# Patient Record
Sex: Male | Born: 1935 | Race: White | Hispanic: No | Marital: Married
Health system: Southern US, Community
[De-identification: ages and names within clinical notes are randomized; demographics above are authoritative.]

---

## 1998-03-21 ENCOUNTER — Encounter: Admission: RE | Admit: 1998-03-21 | Discharge: 1998-06-19 | Payer: Self-pay | Admitting: Radiation Oncology

## 1998-06-20 ENCOUNTER — Inpatient Hospital Stay (HOSPITAL_COMMUNITY): Admission: RE | Admit: 1998-06-20 | Discharge: 1998-06-21 | Payer: Self-pay | Admitting: Urology

## 1998-07-21 ENCOUNTER — Encounter: Admission: RE | Admit: 1998-07-21 | Discharge: 1998-10-19 | Payer: Self-pay | Admitting: Radiation Oncology

## 1999-01-14 ENCOUNTER — Ambulatory Visit (HOSPITAL_COMMUNITY): Admission: RE | Admit: 1999-01-14 | Discharge: 1999-01-14 | Payer: Self-pay

## 1999-12-23 ENCOUNTER — Encounter (INDEPENDENT_AMBULATORY_CARE_PROVIDER_SITE_OTHER): Payer: Self-pay | Admitting: Specialist

## 1999-12-23 ENCOUNTER — Ambulatory Visit (HOSPITAL_BASED_OUTPATIENT_CLINIC_OR_DEPARTMENT_OTHER): Admission: RE | Admit: 1999-12-23 | Discharge: 1999-12-23 | Payer: Self-pay | Admitting: Orthopedic Surgery

## 2000-05-11 ENCOUNTER — Other Ambulatory Visit: Admission: RE | Admit: 2000-05-11 | Discharge: 2000-05-11 | Payer: Self-pay

## 2001-02-17 ENCOUNTER — Encounter (INDEPENDENT_AMBULATORY_CARE_PROVIDER_SITE_OTHER): Payer: Self-pay | Admitting: Specialist

## 2001-02-17 ENCOUNTER — Ambulatory Visit (HOSPITAL_COMMUNITY): Admission: RE | Admit: 2001-02-17 | Discharge: 2001-02-17 | Payer: Self-pay | Admitting: Gastroenterology

## 2001-09-25 ENCOUNTER — Ambulatory Visit (HOSPITAL_BASED_OUTPATIENT_CLINIC_OR_DEPARTMENT_OTHER): Admission: RE | Admit: 2001-09-25 | Discharge: 2001-09-25 | Payer: Self-pay | Admitting: Internal Medicine

## 2001-11-10 ENCOUNTER — Ambulatory Visit (HOSPITAL_BASED_OUTPATIENT_CLINIC_OR_DEPARTMENT_OTHER): Admission: RE | Admit: 2001-11-10 | Discharge: 2001-11-10 | Payer: Self-pay | Admitting: Internal Medicine

## 2002-10-25 ENCOUNTER — Ambulatory Visit (HOSPITAL_COMMUNITY): Admission: RE | Admit: 2002-10-25 | Discharge: 2002-10-25 | Payer: Self-pay | Admitting: Gastroenterology

## 2003-03-19 ENCOUNTER — Ambulatory Visit (HOSPITAL_COMMUNITY): Admission: RE | Admit: 2003-03-19 | Discharge: 2003-03-19 | Payer: Self-pay | Admitting: Gastroenterology

## 2003-03-19 ENCOUNTER — Encounter: Payer: Self-pay | Admitting: Gastroenterology

## 2003-04-05 ENCOUNTER — Ambulatory Visit (HOSPITAL_BASED_OUTPATIENT_CLINIC_OR_DEPARTMENT_OTHER): Admission: RE | Admit: 2003-04-05 | Discharge: 2003-04-05 | Payer: Self-pay | Admitting: Otolaryngology

## 2003-04-05 ENCOUNTER — Encounter (INDEPENDENT_AMBULATORY_CARE_PROVIDER_SITE_OTHER): Payer: Self-pay | Admitting: Specialist

## 2003-10-25 ENCOUNTER — Ambulatory Visit (HOSPITAL_COMMUNITY): Admission: RE | Admit: 2003-10-25 | Discharge: 2003-10-25 | Payer: Self-pay | Admitting: Oncology

## 2003-11-21 ENCOUNTER — Ambulatory Visit (HOSPITAL_COMMUNITY): Admission: RE | Admit: 2003-11-21 | Discharge: 2003-11-21 | Payer: Self-pay | Admitting: General Surgery

## 2003-12-06 ENCOUNTER — Encounter (INDEPENDENT_AMBULATORY_CARE_PROVIDER_SITE_OTHER): Payer: Self-pay | Admitting: Specialist

## 2003-12-06 ENCOUNTER — Ambulatory Visit (HOSPITAL_COMMUNITY): Admission: RE | Admit: 2003-12-06 | Discharge: 2003-12-07 | Payer: Self-pay | Admitting: General Surgery

## 2004-06-29 ENCOUNTER — Encounter: Admission: RE | Admit: 2004-06-29 | Discharge: 2004-06-29 | Payer: Self-pay | Admitting: Gastroenterology

## 2004-07-07 ENCOUNTER — Encounter: Admission: RE | Admit: 2004-07-07 | Discharge: 2004-07-07 | Payer: Self-pay | Admitting: General Surgery

## 2004-07-07 ENCOUNTER — Ambulatory Visit (HOSPITAL_COMMUNITY): Admission: RE | Admit: 2004-07-07 | Discharge: 2004-07-07 | Payer: Self-pay | Admitting: Oncology

## 2004-07-08 ENCOUNTER — Ambulatory Visit (HOSPITAL_BASED_OUTPATIENT_CLINIC_OR_DEPARTMENT_OTHER): Admission: RE | Admit: 2004-07-08 | Discharge: 2004-07-08 | Payer: Self-pay | Admitting: General Surgery

## 2004-07-08 ENCOUNTER — Ambulatory Visit (HOSPITAL_COMMUNITY): Admission: RE | Admit: 2004-07-08 | Discharge: 2004-07-08 | Payer: Self-pay | Admitting: General Surgery

## 2004-07-08 ENCOUNTER — Encounter (INDEPENDENT_AMBULATORY_CARE_PROVIDER_SITE_OTHER): Payer: Self-pay | Admitting: Specialist

## 2004-07-08 ENCOUNTER — Encounter (INDEPENDENT_AMBULATORY_CARE_PROVIDER_SITE_OTHER): Payer: Self-pay | Admitting: General Surgery

## 2004-07-21 ENCOUNTER — Ambulatory Visit (HOSPITAL_COMMUNITY): Admission: RE | Admit: 2004-07-21 | Discharge: 2004-07-21 | Payer: Self-pay | Admitting: Cardiovascular Disease

## 2004-08-04 ENCOUNTER — Inpatient Hospital Stay (HOSPITAL_COMMUNITY): Admission: EM | Admit: 2004-08-04 | Discharge: 2004-08-07 | Payer: Self-pay | Admitting: Emergency Medicine

## 2004-09-16 ENCOUNTER — Ambulatory Visit (HOSPITAL_COMMUNITY): Admission: RE | Admit: 2004-09-16 | Discharge: 2004-09-16 | Payer: Self-pay | Admitting: Oncology

## 2004-10-20 ENCOUNTER — Ambulatory Visit: Payer: Self-pay | Admitting: Oncology

## 2005-01-04 ENCOUNTER — Ambulatory Visit: Payer: Self-pay | Admitting: Oncology

## 2005-01-07 ENCOUNTER — Ambulatory Visit (HOSPITAL_COMMUNITY): Admission: RE | Admit: 2005-01-07 | Discharge: 2005-01-07 | Payer: Self-pay | Admitting: Oncology

## 2005-02-18 ENCOUNTER — Ambulatory Visit: Payer: Self-pay

## 2005-02-23 ENCOUNTER — Ambulatory Visit: Payer: Self-pay | Admitting: Cardiology

## 2005-02-25 ENCOUNTER — Ambulatory Visit: Payer: Self-pay | Admitting: Cardiology

## 2005-04-30 ENCOUNTER — Ambulatory Visit: Payer: Self-pay | Admitting: Oncology

## 2005-05-03 ENCOUNTER — Ambulatory Visit: Payer: Self-pay | Admitting: Cardiology

## 2005-05-03 ENCOUNTER — Ambulatory Visit (HOSPITAL_COMMUNITY): Admission: RE | Admit: 2005-05-03 | Discharge: 2005-05-03 | Payer: Self-pay | Admitting: Oncology

## 2005-06-08 ENCOUNTER — Encounter: Admission: RE | Admit: 2005-06-08 | Discharge: 2005-06-08 | Payer: Self-pay | Admitting: General Surgery

## 2005-06-10 ENCOUNTER — Ambulatory Visit (HOSPITAL_BASED_OUTPATIENT_CLINIC_OR_DEPARTMENT_OTHER): Admission: RE | Admit: 2005-06-10 | Discharge: 2005-06-10 | Payer: Self-pay | Admitting: General Surgery

## 2005-06-10 ENCOUNTER — Ambulatory Visit (HOSPITAL_COMMUNITY): Admission: RE | Admit: 2005-06-10 | Discharge: 2005-06-10 | Payer: Self-pay | Admitting: General Surgery

## 2005-06-10 ENCOUNTER — Encounter (INDEPENDENT_AMBULATORY_CARE_PROVIDER_SITE_OTHER): Payer: Self-pay | Admitting: Specialist

## 2005-10-22 ENCOUNTER — Ambulatory Visit: Payer: Self-pay | Admitting: Oncology

## 2005-10-27 ENCOUNTER — Ambulatory Visit (HOSPITAL_COMMUNITY): Admission: RE | Admit: 2005-10-27 | Discharge: 2005-10-27 | Payer: Self-pay | Admitting: Oncology

## 2005-12-10 ENCOUNTER — Ambulatory Visit: Payer: Self-pay | Admitting: Oncology

## 2006-04-21 ENCOUNTER — Ambulatory Visit: Payer: Self-pay | Admitting: Oncology

## 2006-04-25 LAB — CBC WITH DIFFERENTIAL/PLATELET
BASO%: 1 % (ref 0.0–2.0)
Basophils Absolute: 0 10*3/uL (ref 0.0–0.1)
HCT: 46.6 % (ref 38.7–49.9)
HGB: 16.3 g/dL (ref 13.0–17.1)
LYMPH%: 20.8 % (ref 14.0–48.0)
MCHC: 35.1 g/dL (ref 32.0–35.9)
MONO#: 0.3 10*3/uL (ref 0.1–0.9)
NEUT%: 62.9 % (ref 40.0–75.0)
Platelets: 101 10*3/uL — ABNORMAL LOW (ref 145–400)
WBC: 3.5 10*3/uL — ABNORMAL LOW (ref 4.0–10.0)

## 2006-04-27 LAB — IMMUNOFIXATION ELECTROPHORESIS
IgA: 36 mg/dL — ABNORMAL LOW (ref 68–378)
IgG (Immunoglobin G), Serum: 226 mg/dL — ABNORMAL LOW (ref 694–1618)
IgM, Serum: 16 mg/dL — ABNORMAL LOW (ref 60–263)
Total Protein, Serum Electrophoresis: 5.9 g/dL — ABNORMAL LOW (ref 6.0–8.3)

## 2006-04-27 LAB — COMPREHENSIVE METABOLIC PANEL
BUN: 12 mg/dL (ref 6–23)
CO2: 29 mEq/L (ref 19–32)
Calcium: 9.2 mg/dL (ref 8.4–10.5)
Creatinine, Ser: 1 mg/dL (ref 0.4–1.5)
Glucose, Bld: 108 mg/dL — ABNORMAL HIGH (ref 70–99)
Total Bilirubin: 1.1 mg/dL (ref 0.3–1.2)

## 2006-04-27 LAB — LACTATE DEHYDROGENASE: LDH: 159 U/L (ref 94–250)

## 2006-06-06 ENCOUNTER — Ambulatory Visit: Payer: Self-pay | Admitting: Oncology

## 2006-07-21 ENCOUNTER — Ambulatory Visit: Payer: Self-pay | Admitting: Cardiology

## 2006-10-20 ENCOUNTER — Ambulatory Visit: Payer: Self-pay | Admitting: Oncology

## 2006-10-24 LAB — CBC WITH DIFFERENTIAL/PLATELET
Basophils Absolute: 0 10*3/uL (ref 0.0–0.1)
Eosinophils Absolute: 0.2 10*3/uL (ref 0.0–0.5)
HCT: 47.8 % (ref 38.7–49.9)
HGB: 16.8 g/dL (ref 13.0–17.1)
MONO#: 0.4 10*3/uL (ref 0.1–0.9)
NEUT%: 76.7 % — ABNORMAL HIGH (ref 40.0–75.0)
Platelets: 130 10*3/uL — ABNORMAL LOW (ref 145–400)
WBC: 6.2 10*3/uL (ref 4.0–10.0)
lymph#: 0.9 10*3/uL (ref 0.9–3.3)

## 2006-10-25 LAB — COMPREHENSIVE METABOLIC PANEL
AST: 14 U/L (ref 0–37)
Albumin: 4.6 g/dL (ref 3.5–5.2)
BUN: 13 mg/dL (ref 6–23)
CO2: 25 mEq/L (ref 19–32)
Calcium: 9 mg/dL (ref 8.4–10.5)
Chloride: 108 mEq/L (ref 96–112)
Glucose, Bld: 101 mg/dL — ABNORMAL HIGH (ref 70–99)
Potassium: 4.1 mEq/L (ref 3.5–5.3)

## 2006-10-25 LAB — LACTATE DEHYDROGENASE: LDH: 124 U/L (ref 94–250)

## 2006-12-02 ENCOUNTER — Ambulatory Visit: Payer: Self-pay | Admitting: Oncology

## 2007-01-19 ENCOUNTER — Ambulatory Visit: Payer: Self-pay | Admitting: Oncology

## 2007-01-23 LAB — CBC WITH DIFFERENTIAL/PLATELET
Basophils Absolute: 0.1 10*3/uL (ref 0.0–0.1)
EOS%: 4 % (ref 0.0–7.0)
Eosinophils Absolute: 0.2 10*3/uL (ref 0.0–0.5)
HGB: 18.1 g/dL — ABNORMAL HIGH (ref 13.0–17.1)
NEUT#: 2.4 10*3/uL (ref 1.5–6.5)
RDW: 14.6 % (ref 11.2–14.6)
lymph#: 1 10*3/uL (ref 0.9–3.3)

## 2007-01-23 LAB — COMPREHENSIVE METABOLIC PANEL
AST: 18 U/L (ref 0–37)
Albumin: 4.9 g/dL (ref 3.5–5.2)
BUN: 13 mg/dL (ref 6–23)
Calcium: 10 mg/dL (ref 8.4–10.5)
Chloride: 109 mEq/L (ref 96–112)
Potassium: 5.1 mEq/L (ref 3.5–5.3)
Sodium: 145 mEq/L (ref 135–145)
Total Protein: 6.5 g/dL (ref 6.0–8.3)

## 2007-01-23 LAB — IGG, IGA, IGM: IgM, Serum: 12 mg/dL — ABNORMAL LOW (ref 60–263)

## 2007-04-27 ENCOUNTER — Ambulatory Visit: Payer: Self-pay | Admitting: Oncology

## 2007-05-01 LAB — COMPREHENSIVE METABOLIC PANEL
Albumin: 4.4 g/dL (ref 3.5–5.2)
Alkaline Phosphatase: 72 U/L (ref 39–117)
BUN: 11 mg/dL (ref 6–23)
CO2: 29 mEq/L (ref 19–32)
Calcium: 9.6 mg/dL (ref 8.4–10.5)
Glucose, Bld: 147 mg/dL — ABNORMAL HIGH (ref 70–99)
Potassium: 4 mEq/L (ref 3.5–5.3)
Total Protein: 5.9 g/dL — ABNORMAL LOW (ref 6.0–8.3)

## 2007-05-01 LAB — CBC WITH DIFFERENTIAL/PLATELET
Basophils Absolute: 0 10*3/uL (ref 0.0–0.1)
EOS%: 1.6 % (ref 0.0–7.0)
Eosinophils Absolute: 0.1 10*3/uL (ref 0.0–0.5)
HCT: 46.4 % (ref 38.7–49.9)
HGB: 16.6 g/dL (ref 13.0–17.1)
LYMPH%: 24.3 % (ref 14.0–48.0)
MCH: 30.7 pg (ref 28.0–33.4)
MCV: 86 fL (ref 81.6–98.0)
MONO%: 7.7 % (ref 0.0–13.0)
NEUT%: 65.5 % (ref 40.0–75.0)
Platelets: 121 10*3/uL — ABNORMAL LOW (ref 145–400)
RDW: 14.4 % (ref 11.2–14.6)

## 2007-05-01 LAB — IGG, IGA, IGM
IgA: 36 mg/dL — ABNORMAL LOW (ref 68–378)
IgG (Immunoglobin G), Serum: 264 mg/dL — ABNORMAL LOW (ref 694–1618)

## 2007-05-01 LAB — BETA 2 MICROGLOBULIN, SERUM: Beta-2 Microglobulin: 2.07 mg/L — ABNORMAL HIGH (ref 1.01–1.73)

## 2007-07-06 ENCOUNTER — Encounter: Admission: RE | Admit: 2007-07-06 | Discharge: 2007-07-06 | Payer: Self-pay | Admitting: Neurosurgery

## 2007-07-13 ENCOUNTER — Ambulatory Visit: Payer: Self-pay | Admitting: Oncology

## 2007-07-13 LAB — CBC WITH DIFFERENTIAL/PLATELET
BASO%: 1.3 % (ref 0.0–2.0)
EOS%: 4.5 % (ref 0.0–7.0)
LYMPH%: 20.2 % (ref 14.0–48.0)
MCH: 29.2 pg (ref 28.0–33.4)
MCHC: 34.5 g/dL (ref 32.0–35.9)
MCV: 84.7 fL (ref 81.6–98.0)
MONO%: 8.5 % (ref 0.0–13.0)
NEUT#: 7.2 10*3/uL — ABNORMAL HIGH (ref 1.5–6.5)
Platelets: 198 10*3/uL (ref 145–400)
RBC: 5.58 10*6/uL (ref 4.20–5.71)
RDW: 14.2 % (ref 11.2–14.6)

## 2007-07-13 LAB — COMPREHENSIVE METABOLIC PANEL
AST: 27 U/L (ref 0–37)
Albumin: 4 g/dL (ref 3.5–5.2)
Alkaline Phosphatase: 130 U/L — ABNORMAL HIGH (ref 39–117)
BUN: 15 mg/dL (ref 6–23)
Creatinine, Ser: 1.01 mg/dL (ref 0.40–1.50)
Glucose, Bld: 131 mg/dL — ABNORMAL HIGH (ref 70–99)

## 2007-07-13 LAB — MORPHOLOGY: RBC Comments: NORMAL

## 2007-07-14 ENCOUNTER — Ambulatory Visit (HOSPITAL_COMMUNITY): Admission: RE | Admit: 2007-07-14 | Discharge: 2007-07-14 | Payer: Self-pay | Admitting: Oncology

## 2007-07-18 LAB — IMMUNOFIXATION ELECTROPHORESIS
IgA: 40 mg/dL — ABNORMAL LOW (ref 68–378)
IgG (Immunoglobin G), Serum: 241 mg/dL — ABNORMAL LOW (ref 694–1618)
IgM, Serum: 57 mg/dL — ABNORMAL LOW (ref 60–263)

## 2007-07-18 LAB — BETA 2 MICROGLOBULIN, SERUM: Beta-2 Microglobulin: 2.42 mg/L — ABNORMAL HIGH (ref 1.01–1.73)

## 2007-07-24 ENCOUNTER — Ambulatory Visit: Payer: Self-pay | Admitting: Cardiology

## 2007-07-25 LAB — COMPREHENSIVE METABOLIC PANEL
ALT: 25 U/L (ref 0–53)
AST: 17 U/L (ref 0–37)
Alkaline Phosphatase: 79 U/L (ref 39–117)
Creatinine, Ser: 1 mg/dL (ref 0.40–1.50)
Total Bilirubin: 1.3 mg/dL — ABNORMAL HIGH (ref 0.3–1.2)

## 2007-07-25 LAB — CBC WITH DIFFERENTIAL/PLATELET
Basophils Absolute: 0.1 10*3/uL (ref 0.0–0.1)
Eosinophils Absolute: 0.3 10*3/uL (ref 0.0–0.5)
HGB: 16.1 g/dL (ref 13.0–17.1)
MCV: 85.4 fL (ref 81.6–98.0)
MONO#: 0.6 10*3/uL (ref 0.1–0.9)
MONO%: 5.8 % (ref 0.0–13.0)
NEUT#: 5.6 10*3/uL (ref 1.5–6.5)
RDW: 13.3 % (ref 11.2–14.6)

## 2007-08-01 LAB — CBC WITH DIFFERENTIAL/PLATELET
Basophils Absolute: 0.1 10*3/uL (ref 0.0–0.1)
Eosinophils Absolute: 0.5 10*3/uL (ref 0.0–0.5)
HCT: 48.9 % (ref 38.7–49.9)
HGB: 17.3 g/dL — ABNORMAL HIGH (ref 13.0–17.1)
LYMPH%: 31.3 % (ref 14.0–48.0)
MCV: 85.3 fL (ref 81.6–98.0)
MONO%: 4.6 % (ref 0.0–13.0)
NEUT#: 5.9 10*3/uL (ref 1.5–6.5)
NEUT%: 58.1 % (ref 40.0–75.0)
Platelets: 113 10*3/uL — ABNORMAL LOW (ref 145–400)
RBC: 5.74 10*6/uL — ABNORMAL HIGH (ref 4.20–5.71)

## 2007-08-04 ENCOUNTER — Ambulatory Visit: Payer: Self-pay

## 2007-08-08 LAB — CBC WITH DIFFERENTIAL/PLATELET
BASO%: 1.5 % (ref 0.0–2.0)
HCT: 48.4 % (ref 38.7–49.9)
LYMPH%: 37.2 % (ref 14.0–48.0)
MCH: 29.4 pg (ref 28.0–33.4)
MCHC: 35.2 g/dL (ref 32.0–35.9)
MCV: 83.4 fL (ref 81.6–98.0)
MONO%: 4.9 % (ref 0.0–13.0)
NEUT%: 50.2 % (ref 40.0–75.0)
Platelets: 93 10*3/uL — ABNORMAL LOW (ref 145–400)
RBC: 5.8 10*6/uL — ABNORMAL HIGH (ref 4.20–5.71)

## 2007-08-10 ENCOUNTER — Encounter (INDEPENDENT_AMBULATORY_CARE_PROVIDER_SITE_OTHER): Payer: Self-pay | Admitting: General Surgery

## 2007-08-10 ENCOUNTER — Ambulatory Visit (HOSPITAL_COMMUNITY): Admission: RE | Admit: 2007-08-10 | Discharge: 2007-08-10 | Payer: Self-pay | Admitting: General Surgery

## 2007-08-15 LAB — CBC WITH DIFFERENTIAL/PLATELET
BASO%: 0.9 % (ref 0.0–2.0)
EOS%: 4.1 % (ref 0.0–7.0)
LYMPH%: 35.8 % (ref 14.0–48.0)
MCH: 28.9 pg (ref 28.0–33.4)
MCHC: 34.7 g/dL (ref 32.0–35.9)
MONO#: 0.5 10*3/uL (ref 0.1–0.9)
NEUT%: 52.4 % (ref 40.0–75.0)
Platelets: 118 10*3/uL — ABNORMAL LOW (ref 145–400)
RBC: 6.09 10*6/uL — ABNORMAL HIGH (ref 4.20–5.71)
WBC: 8 10*3/uL (ref 4.0–10.0)
lymph#: 2.9 10*3/uL (ref 0.9–3.3)

## 2007-08-15 LAB — COMPREHENSIVE METABOLIC PANEL
ALT: 19 U/L (ref 0–53)
AST: 13 U/L (ref 0–37)
CO2: 23 mEq/L (ref 19–32)
Creatinine, Ser: 0.95 mg/dL (ref 0.40–1.50)
Sodium: 139 mEq/L (ref 135–145)
Total Bilirubin: 1.4 mg/dL — ABNORMAL HIGH (ref 0.3–1.2)
Total Protein: 6.8 g/dL (ref 6.0–8.3)

## 2007-08-16 LAB — IGG, IGA, IGM: IgG (Immunoglobin G), Serum: 873 mg/dL (ref 694–1618)

## 2007-08-16 LAB — BETA 2 MICROGLOBULIN, SERUM: Beta-2 Microglobulin: 2.22 mg/L — ABNORMAL HIGH (ref 1.01–1.73)

## 2007-08-17 ENCOUNTER — Inpatient Hospital Stay (HOSPITAL_COMMUNITY): Admission: RE | Admit: 2007-08-17 | Discharge: 2007-08-22 | Payer: Self-pay | Admitting: Neurosurgery

## 2007-09-15 ENCOUNTER — Ambulatory Visit: Payer: Self-pay | Admitting: Oncology

## 2007-09-19 LAB — COMPREHENSIVE METABOLIC PANEL
ALT: 28 U/L (ref 0–53)
AST: 16 U/L (ref 0–37)
Albumin: 3.8 g/dL (ref 3.5–5.2)
Alkaline Phosphatase: 67 U/L (ref 39–117)
Calcium: 9 mg/dL (ref 8.4–10.5)
Chloride: 109 mEq/L (ref 96–112)
Creatinine, Ser: 0.92 mg/dL (ref 0.40–1.50)
Potassium: 3.7 mEq/L (ref 3.5–5.3)

## 2007-09-19 LAB — CBC WITH DIFFERENTIAL/PLATELET
BASO%: 0.5 % (ref 0.0–2.0)
EOS%: 2.7 % (ref 0.0–7.0)
MCH: 29.9 pg (ref 28.0–33.4)
MCHC: 35.7 g/dL (ref 32.0–35.9)
RBC: 4.99 10*6/uL (ref 4.20–5.71)
RDW: 16.7 % — ABNORMAL HIGH (ref 11.2–14.6)
lymph#: 1.4 10*3/uL (ref 0.9–3.3)

## 2007-09-21 LAB — BETA 2 MICROGLOBULIN, SERUM: Beta-2 Microglobulin: 2.34 mg/L — ABNORMAL HIGH (ref 1.01–1.73)

## 2007-09-21 LAB — IMMUNOFIXATION ELECTROPHORESIS: IgA: 33 mg/dL — ABNORMAL LOW (ref 68–378)

## 2007-09-27 ENCOUNTER — Ambulatory Visit (HOSPITAL_COMMUNITY): Admission: RE | Admit: 2007-09-27 | Discharge: 2007-09-27 | Payer: Self-pay | Admitting: Oncology

## 2007-10-18 ENCOUNTER — Inpatient Hospital Stay (HOSPITAL_COMMUNITY): Admission: RE | Admit: 2007-10-18 | Discharge: 2007-10-24 | Payer: Self-pay | Admitting: Otolaryngology

## 2007-10-19 ENCOUNTER — Encounter (INDEPENDENT_AMBULATORY_CARE_PROVIDER_SITE_OTHER): Payer: Self-pay | Admitting: Otolaryngology

## 2007-11-01 ENCOUNTER — Encounter (HOSPITAL_BASED_OUTPATIENT_CLINIC_OR_DEPARTMENT_OTHER): Admission: RE | Admit: 2007-11-01 | Discharge: 2007-11-22 | Payer: Self-pay | Admitting: Surgery

## 2007-11-02 ENCOUNTER — Ambulatory Visit: Payer: Self-pay | Admitting: Oncology

## 2007-11-05 IMAGING — CT NM PET TUM IMG SKULL BASE T - THIGH
1 of 6 series · 1 of 25 positions shown · IV contrast ([ID])
Comparison: 07/14/07.

CLINICAL DATA: Newly diagnosed neck melanoma with history of non-Hodgkin's lymphoma and prostate carcinoma.  Restaging.
 FDG PET-CT TUMOR IMAGING (SKULL BASE TO THIGHS):
 Fasting Blood Glucose:  100
TECHNIQUE: 17.4 mCi F18-FDG were administered via the right arm.  Full ring PET imaging was performed from the skull base through the mid-thighs 56 minutes after injection.  CT data was obtained and used for attenuation correction and anatomic localization only.  (This was not acquired as a diagnostic CT examination.)

[Series 2: ct images · axial · 3.8mm · 0.98mm/px · 1 of 267 slices shown]
[im 267/267  brain]
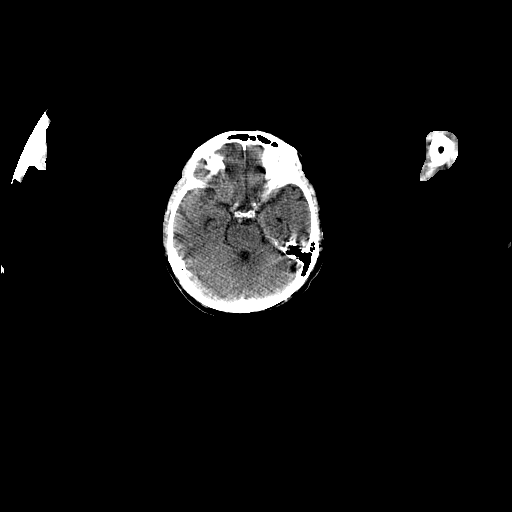

[1 of 25 positions shown; findings below may reference images not displayed]

FINDINGS: No abnormal sites of FDG uptake are identified within the neck.  No sites of abnormal FDG uptake are identified within the thorax.  The previously seen sites of FDG uptake within the right upper lobe and left lower lobe have resolved.  
 No abnormal sites of FDG uptake are identified within the abdomen or pelvis.
IMPRESSION: Complete metabolic response to therapy.  No evidence of metabolically active tumor.

## 2007-11-07 LAB — CBC WITH DIFFERENTIAL/PLATELET
EOS%: 0.6 % (ref 0.0–7.0)
HCT: 39.6 % (ref 38.7–49.9)
HGB: 13.8 g/dL (ref 13.0–17.1)
MCHC: 34.9 g/dL (ref 32.0–35.9)
MONO%: 1.3 % (ref 0.0–13.0)
NEUT#: 6.5 10*3/uL (ref 1.5–6.5)
Platelets: 236 10*3/uL (ref 145–400)
WBC: 8.2 10*3/uL (ref 4.0–10.0)

## 2007-11-08 LAB — COMPREHENSIVE METABOLIC PANEL
AST: 20 U/L (ref 0–37)
Albumin: 4.3 g/dL (ref 3.5–5.2)
Alkaline Phosphatase: 79 U/L (ref 39–117)
BUN: 14 mg/dL (ref 6–23)
Potassium: 4 mEq/L (ref 3.5–5.3)
Sodium: 141 mEq/L (ref 135–145)
Total Protein: 6.1 g/dL (ref 6.0–8.3)

## 2007-11-08 LAB — IGG, IGA, IGM: IgM, Serum: 11 mg/dL — ABNORMAL LOW (ref 60–263)

## 2007-11-23 ENCOUNTER — Encounter (HOSPITAL_BASED_OUTPATIENT_CLINIC_OR_DEPARTMENT_OTHER): Admission: RE | Admit: 2007-11-23 | Discharge: 2007-12-26 | Payer: Self-pay | Admitting: Surgery

## 2008-01-25 ENCOUNTER — Ambulatory Visit: Payer: Self-pay | Admitting: Oncology

## 2008-01-29 LAB — CBC WITH DIFFERENTIAL/PLATELET
BASO%: 0.5 % (ref 0.0–2.0)
EOS%: 1.3 % (ref 0.0–7.0)
LYMPH%: 27 % (ref 14.0–48.0)
MCH: 27.2 pg — ABNORMAL LOW (ref 28.0–33.4)
MCHC: 33.1 g/dL (ref 32.0–35.9)
MCV: 82.2 fL (ref 81.6–98.0)
MONO#: 0.3 10*3/uL (ref 0.1–0.9)
MONO%: 3.2 % (ref 0.0–13.0)
NEUT%: 68 % (ref 40.0–75.0)
Platelets: 159 10*3/uL (ref 145–400)
RBC: 5.78 10*6/uL — ABNORMAL HIGH (ref 4.20–5.71)
WBC: 9.6 10*3/uL (ref 4.0–10.0)

## 2008-01-29 LAB — COMPREHENSIVE METABOLIC PANEL
ALT: 18 U/L (ref 0–53)
AST: 20 U/L (ref 0–37)
Alkaline Phosphatase: 62 U/L (ref 39–117)
Creatinine, Ser: 0.93 mg/dL (ref 0.40–1.50)
Sodium: 141 mEq/L (ref 135–145)
Total Bilirubin: 1 mg/dL (ref 0.3–1.2)
Total Protein: 5.9 g/dL — ABNORMAL LOW (ref 6.0–8.3)

## 2008-01-30 LAB — IGG, IGA, IGM: IgM, Serum: 12 mg/dL — ABNORMAL LOW (ref 60–263)

## 2008-04-26 ENCOUNTER — Ambulatory Visit: Payer: Self-pay | Admitting: Oncology

## 2008-04-30 LAB — CBC WITH DIFFERENTIAL/PLATELET
Basophils Absolute: 0.3 10*3/uL — ABNORMAL HIGH (ref 0.0–0.1)
EOS%: 1.5 % (ref 0.0–7.0)
Eosinophils Absolute: 0.2 10*3/uL (ref 0.0–0.5)
HGB: 16.3 g/dL (ref 13.0–17.1)
LYMPH%: 61.7 % — ABNORMAL HIGH (ref 14.0–48.0)
MCH: 29.2 pg (ref 28.0–33.4)
MCV: 84.1 fL (ref 81.6–98.0)
MONO%: 2.8 % (ref 0.0–13.0)
NEUT#: 4.2 10*3/uL (ref 1.5–6.5)
NEUT%: 32.1 % — ABNORMAL LOW (ref 40.0–75.0)
Platelets: 97 10*3/uL — ABNORMAL LOW (ref 145–400)

## 2008-05-01 LAB — COMPREHENSIVE METABOLIC PANEL
Albumin: 4.8 g/dL (ref 3.5–5.2)
Alkaline Phosphatase: 63 U/L (ref 39–117)
BUN: 15 mg/dL (ref 6–23)
Creatinine, Ser: 0.98 mg/dL (ref 0.40–1.50)
Glucose, Bld: 155 mg/dL — ABNORMAL HIGH (ref 70–99)
Total Bilirubin: 0.9 mg/dL (ref 0.3–1.2)

## 2008-05-01 LAB — IGG, IGA, IGM
IgA: 19 mg/dL — ABNORMAL LOW (ref 68–378)
IgG (Immunoglobin G), Serum: 206 mg/dL — ABNORMAL LOW (ref 694–1618)

## 2008-05-01 LAB — BETA 2 MICROGLOBULIN, SERUM: Beta-2 Microglobulin: 2.57 mg/L — ABNORMAL HIGH (ref 1.01–1.73)

## 2008-07-30 ENCOUNTER — Ambulatory Visit: Payer: Self-pay | Admitting: Cardiology

## 2008-07-30 ENCOUNTER — Ambulatory Visit: Payer: Self-pay | Admitting: Oncology

## 2008-08-21 ENCOUNTER — Ambulatory Visit: Payer: Self-pay | Admitting: Vascular Surgery

## 2008-10-25 ENCOUNTER — Ambulatory Visit: Payer: Self-pay | Admitting: Oncology

## 2008-10-29 LAB — CBC WITH DIFFERENTIAL/PLATELET
Basophils Absolute: 0.1 10*3/uL (ref 0.0–0.1)
Eosinophils Absolute: 0.1 10*3/uL (ref 0.0–0.5)
HGB: 13.7 g/dL (ref 13.0–17.1)
MCV: 92.1 fL (ref 81.6–98.0)
MONO%: 0.5 % (ref 0.0–13.0)
NEUT#: 1 10*3/uL — ABNORMAL LOW (ref 1.5–6.5)
Platelets: 68 10*3/uL — ABNORMAL LOW (ref 145–400)
RDW: 16.3 % — ABNORMAL HIGH (ref 11.2–14.6)

## 2008-10-30 LAB — LACTATE DEHYDROGENASE: LDH: 138 U/L (ref 94–250)

## 2008-10-30 LAB — COMPREHENSIVE METABOLIC PANEL
Albumin: 3.7 g/dL (ref 3.5–5.2)
CO2: 22 mEq/L (ref 19–32)
Glucose, Bld: 182 mg/dL — ABNORMAL HIGH (ref 70–99)
Potassium: 3.8 mEq/L (ref 3.5–5.3)
Sodium: 142 mEq/L (ref 135–145)
Total Bilirubin: 0.6 mg/dL (ref 0.3–1.2)
Total Protein: 5.6 g/dL — ABNORMAL LOW (ref 6.0–8.3)

## 2008-10-30 LAB — URIC ACID: Uric Acid, Serum: 5.5 mg/dL (ref 4.0–7.8)

## 2008-10-30 LAB — IGG, IGA, IGM: IgG (Immunoglobin G), Serum: 141 mg/dL — ABNORMAL LOW (ref 694–1618)

## 2008-10-31 ENCOUNTER — Ambulatory Visit (HOSPITAL_COMMUNITY): Admission: RE | Admit: 2008-10-31 | Discharge: 2008-10-31 | Payer: Self-pay | Admitting: Oncology

## 2008-11-27 LAB — CBC WITH DIFFERENTIAL/PLATELET
BASO%: 2.5 % — ABNORMAL HIGH (ref 0.0–2.0)
EOS%: 0.1 % (ref 0.0–7.0)
HCT: 39.6 % (ref 38.7–49.9)
MCH: 30.1 pg (ref 28.0–33.4)
MCHC: 33.8 g/dL (ref 32.0–35.9)
NEUT%: 3.4 % — ABNORMAL LOW (ref 40.0–75.0)
RBC: 4.46 10*6/uL (ref 4.20–5.71)
lymph#: 125 10*3/uL — ABNORMAL HIGH (ref 0.9–3.3)

## 2008-11-27 LAB — TECHNOLOGIST REVIEW

## 2008-12-11 ENCOUNTER — Ambulatory Visit: Payer: Self-pay | Admitting: Oncology

## 2008-12-11 LAB — CBC WITH DIFFERENTIAL/PLATELET
EOS%: 0.3 % (ref 0.0–7.0)
Eosinophils Absolute: 0.1 10*3/uL (ref 0.0–0.5)
MCH: 30.9 pg (ref 28.0–33.4)
MCV: 87.7 fL (ref 81.6–98.0)
MONO%: 2.1 % (ref 0.0–13.0)
NEUT#: 2.1 10*3/uL (ref 1.5–6.5)
RBC: 4.43 10*6/uL (ref 4.20–5.71)
RDW: 16.3 % — ABNORMAL HIGH (ref 11.2–14.6)

## 2008-12-11 LAB — TECHNOLOGIST REVIEW

## 2008-12-12 LAB — COMPREHENSIVE METABOLIC PANEL
ALT: 11 U/L (ref 0–53)
AST: 12 U/L (ref 0–37)
Alkaline Phosphatase: 67 U/L (ref 39–117)
Sodium: 141 mEq/L (ref 135–145)
Total Bilirubin: 0.8 mg/dL (ref 0.3–1.2)
Total Protein: 6.6 g/dL (ref 6.0–8.3)

## 2008-12-12 LAB — URIC ACID: Uric Acid, Serum: 4.3 mg/dL (ref 4.0–7.8)

## 2008-12-12 LAB — LACTATE DEHYDROGENASE: LDH: 124 U/L (ref 94–250)

## 2008-12-17 LAB — CBC WITH DIFFERENTIAL/PLATELET
Eosinophils Absolute: 0 10*3/uL (ref 0.0–0.5)
MONO#: 0.2 10*3/uL (ref 0.1–0.9)
MONO%: 2 % (ref 0.0–13.0)
NEUT#: 1.7 10*3/uL (ref 1.5–6.5)
RBC: 4.53 10*6/uL (ref 4.20–5.71)
RDW: 15.8 % — ABNORMAL HIGH (ref 11.2–14.6)
WBC: 8.3 10*3/uL (ref 4.0–10.0)

## 2008-12-17 LAB — TECHNOLOGIST REVIEW

## 2008-12-25 ENCOUNTER — Ambulatory Visit: Admission: RE | Admit: 2008-12-25 | Discharge: 2009-02-18 | Payer: Self-pay | Admitting: Radiation Oncology

## 2008-12-25 LAB — CBC WITH DIFFERENTIAL/PLATELET
BASO%: 0.3 % (ref 0.0–2.0)
Basophils Absolute: 0 10*3/uL (ref 0.0–0.1)
EOS%: 0.6 % (ref 0.0–7.0)
Eosinophils Absolute: 0 10*3/uL (ref 0.0–0.5)
HCT: 40.1 % (ref 38.7–49.9)
HGB: 13.8 g/dL (ref 13.0–17.1)
LYMPH%: 72.7 % — ABNORMAL HIGH (ref 14.0–48.0)
MCH: 31.4 pg (ref 28.0–33.4)
MCHC: 34.5 g/dL (ref 32.0–35.9)
MCV: 91 fL (ref 81.6–98.0)
MONO#: 0.1 10*3/uL (ref 0.1–0.9)
MONO%: 1.5 % (ref 0.0–13.0)
NEUT#: 1.8 10*3/uL (ref 1.5–6.5)
NEUT%: 24.9 % — ABNORMAL LOW (ref 40.0–75.0)
Platelets: 93 10*3/uL — ABNORMAL LOW (ref 145–400)
RBC: 4.41 10*6/uL (ref 4.20–5.71)
RDW: 17.4 % — ABNORMAL HIGH (ref 11.2–14.6)
WBC: 7.1 10*3/uL (ref 4.0–10.0)
lymph#: 5.1 10*3/uL — ABNORMAL HIGH (ref 0.9–3.3)

## 2008-12-25 LAB — COMPREHENSIVE METABOLIC PANEL
Albumin: 4.7 g/dL (ref 3.5–5.2)
Alkaline Phosphatase: 62 U/L (ref 39–117)
BUN: 15 mg/dL (ref 6–23)
CO2: 28 mEq/L (ref 19–32)
Glucose, Bld: 82 mg/dL (ref 70–99)
Potassium: 3.8 mEq/L (ref 3.5–5.3)
Total Bilirubin: 1.1 mg/dL (ref 0.3–1.2)

## 2008-12-25 LAB — TECHNOLOGIST REVIEW

## 2008-12-25 LAB — IGG, IGA, IGM
IgA: 30 mg/dL — ABNORMAL LOW (ref 68–378)
IgG (Immunoglobin G), Serum: 588 mg/dL — ABNORMAL LOW (ref 694–1618)
IgM, Serum: 70 mg/dL (ref 60–263)

## 2009-01-01 LAB — COMPREHENSIVE METABOLIC PANEL
ALT: 14 U/L (ref 0–53)
AST: 21 U/L (ref 0–37)
Albumin: 4 g/dL (ref 3.5–5.2)
Alkaline Phosphatase: 47 U/L (ref 39–117)
BUN: 12 mg/dL (ref 6–23)
Calcium: 8.5 mg/dL (ref 8.4–10.5)
Chloride: 102 mEq/L (ref 96–112)
Potassium: 3.9 mEq/L (ref 3.5–5.3)
Sodium: 132 mEq/L — ABNORMAL LOW (ref 135–145)
Total Protein: 5.8 g/dL — ABNORMAL LOW (ref 6.0–8.3)

## 2009-01-01 LAB — CBC WITH DIFFERENTIAL/PLATELET
BASO%: 0.3 % (ref 0.0–2.0)
Basophils Absolute: 0 10*3/uL (ref 0.0–0.1)
EOS%: 0.4 % (ref 0.0–7.0)
HGB: 13.4 g/dL (ref 13.0–17.1)
MCH: 31.7 pg (ref 28.0–33.4)
MCV: 89.8 fL (ref 81.6–98.0)
MONO%: 3 % (ref 0.0–13.0)
RBC: 4.21 10*6/uL (ref 4.20–5.71)
RDW: 17.7 % — ABNORMAL HIGH (ref 11.2–14.6)
lymph#: 1.7 10*3/uL (ref 0.9–3.3)

## 2009-01-06 ENCOUNTER — Ambulatory Visit (HOSPITAL_COMMUNITY): Admission: RE | Admit: 2009-01-06 | Discharge: 2009-01-06 | Payer: Self-pay | Admitting: Oncology

## 2009-01-09 LAB — CBC WITH DIFFERENTIAL/PLATELET
Basophils Absolute: 0 10*3/uL (ref 0.0–0.1)
EOS%: 0.1 % (ref 0.0–7.0)
HCT: 41.4 % (ref 38.7–49.9)
HGB: 14.5 g/dL (ref 13.0–17.1)
LYMPH%: 75.4 % — ABNORMAL HIGH (ref 14.0–48.0)
MCH: 31.4 pg (ref 28.0–33.4)
MCHC: 34.9 g/dL (ref 32.0–35.9)
MONO#: 0.1 10*3/uL (ref 0.1–0.9)
NEUT%: 21.4 % — ABNORMAL LOW (ref 40.0–75.0)
Platelets: 56 10*3/uL — ABNORMAL LOW (ref 145–400)
lymph#: 3.1 10*3/uL (ref 0.9–3.3)

## 2009-01-09 LAB — COMPREHENSIVE METABOLIC PANEL
ALT: 47 U/L (ref 0–53)
AST: 57 U/L — ABNORMAL HIGH (ref 0–37)
Alkaline Phosphatase: 54 U/L (ref 39–117)
BUN: 13 mg/dL (ref 6–23)
Calcium: 8.5 mg/dL (ref 8.4–10.5)
Chloride: 108 mEq/L (ref 96–112)
Creatinine, Ser: 0.82 mg/dL (ref 0.40–1.50)
Total Bilirubin: 0.8 mg/dL (ref 0.3–1.2)

## 2009-01-10 LAB — CBC WITH DIFFERENTIAL/PLATELET
EOS%: 0.1 % (ref 0.0–7.0)
MCH: 31.3 pg (ref 28.0–33.4)
MCV: 90.1 fL (ref 81.6–98.0)
MONO%: 2.7 % (ref 0.0–13.0)
NEUT#: 0.9 10*3/uL — ABNORMAL LOW (ref 1.5–6.5)
RBC: 4.6 10*6/uL (ref 4.20–5.71)
RDW: 17.5 % — ABNORMAL HIGH (ref 11.2–14.6)

## 2009-01-13 LAB — CBC WITH DIFFERENTIAL/PLATELET
Eosinophils Absolute: 0 10*3/uL (ref 0.0–0.5)
MONO#: 0.2 10*3/uL (ref 0.1–0.9)
MONO%: 2.4 % (ref 0.0–13.0)
NEUT#: 1.5 10*3/uL (ref 1.5–6.5)
RBC: 4.48 10*6/uL (ref 4.20–5.71)
RDW: 17 % — ABNORMAL HIGH (ref 11.2–14.6)
WBC: 6.3 10*3/uL (ref 4.0–10.0)

## 2009-01-13 LAB — COMPREHENSIVE METABOLIC PANEL
Albumin: 3.8 g/dL (ref 3.5–5.2)
Alkaline Phosphatase: 52 U/L (ref 39–117)
CO2: 27 mEq/L (ref 19–32)
Glucose, Bld: 105 mg/dL — ABNORMAL HIGH (ref 70–99)
Potassium: 4.2 mEq/L (ref 3.5–5.3)
Sodium: 139 mEq/L (ref 135–145)
Total Protein: 5.5 g/dL — ABNORMAL LOW (ref 6.0–8.3)

## 2009-01-15 LAB — CBC WITH DIFFERENTIAL/PLATELET
BASO%: 0.3 % (ref 0.0–2.0)
EOS%: 0.2 % (ref 0.0–7.0)
Eosinophils Absolute: 0 10*3/uL (ref 0.0–0.5)
MCHC: 35.1 g/dL (ref 32.0–35.9)
MCV: 89.5 fL (ref 81.6–98.0)
MONO%: 3.1 % (ref 0.0–13.0)
NEUT#: 1.3 10*3/uL — ABNORMAL LOW (ref 1.5–6.5)
RBC: 4.55 10*6/uL (ref 4.20–5.71)
RDW: 17.2 % — ABNORMAL HIGH (ref 11.2–14.6)

## 2009-01-15 LAB — TECHNOLOGIST REVIEW

## 2009-01-16 ENCOUNTER — Ambulatory Visit (HOSPITAL_COMMUNITY): Admission: RE | Admit: 2009-01-16 | Discharge: 2009-01-16 | Payer: Self-pay | Admitting: Oncology

## 2009-01-20 ENCOUNTER — Ambulatory Visit: Payer: Self-pay | Admitting: Oncology

## 2009-01-22 LAB — CBC WITH DIFFERENTIAL/PLATELET
BASO%: 0.5 % (ref 0.0–2.0)
EOS%: 0.1 % (ref 0.0–7.0)
MCH: 31.3 pg (ref 28.0–33.4)
MCHC: 34.3 g/dL (ref 32.0–35.9)
RDW: 16.8 % — ABNORMAL HIGH (ref 11.2–14.6)
lymph#: 6.2 10*3/uL — ABNORMAL HIGH (ref 0.9–3.3)

## 2009-01-22 LAB — COMPREHENSIVE METABOLIC PANEL
ALT: 17 U/L (ref 0–53)
AST: 15 U/L (ref 0–37)
Calcium: 8.8 mg/dL (ref 8.4–10.5)
Chloride: 107 mEq/L (ref 96–112)
Creatinine, Ser: 0.79 mg/dL (ref 0.40–1.50)
Potassium: 3.6 mEq/L (ref 3.5–5.3)

## 2009-01-24 ENCOUNTER — Encounter (INDEPENDENT_AMBULATORY_CARE_PROVIDER_SITE_OTHER): Payer: Self-pay | Admitting: Otolaryngology

## 2009-01-24 ENCOUNTER — Inpatient Hospital Stay (HOSPITAL_COMMUNITY): Admission: RE | Admit: 2009-01-24 | Discharge: 2009-01-24 | Payer: Self-pay | Admitting: Otolaryngology

## 2009-02-04 ENCOUNTER — Ambulatory Visit: Payer: Self-pay | Admitting: *Deleted

## 2009-04-15 ENCOUNTER — Ambulatory Visit: Payer: Self-pay | Admitting: Oncology

## 2009-04-17 ENCOUNTER — Ambulatory Visit (HOSPITAL_COMMUNITY): Admission: RE | Admit: 2009-04-17 | Discharge: 2009-04-17 | Payer: Self-pay | Admitting: Oncology

## 2009-04-17 LAB — CBC WITH DIFFERENTIAL/PLATELET
BASO%: 0.4 % (ref 0.0–2.0)
EOS%: 0 % (ref 0.0–7.0)
HCT: 43.2 % (ref 38.4–49.9)
MCH: 31.2 pg (ref 27.2–33.4)
MCHC: 33.9 g/dL (ref 32.0–36.0)
NEUT%: 3 % — ABNORMAL LOW (ref 39.0–75.0)
lymph#: 53.8 10*3/uL — ABNORMAL HIGH (ref 0.9–3.3)

## 2009-04-17 LAB — COMPREHENSIVE METABOLIC PANEL
AST: 22 U/L (ref 0–37)
Albumin: 4.2 g/dL (ref 3.5–5.2)
Alkaline Phosphatase: 89 U/L (ref 39–117)
BUN: 17 mg/dL (ref 6–23)
Creatinine, Ser: 0.99 mg/dL (ref 0.40–1.50)
Potassium: 4.1 mEq/L (ref 3.5–5.3)
Total Bilirubin: 1.2 mg/dL (ref 0.3–1.2)

## 2009-04-18 LAB — BETA 2 MICROGLOBULIN, SERUM: Beta-2 Microglobulin: 4.28 mg/L — ABNORMAL HIGH (ref 1.01–1.73)

## 2009-06-03 ENCOUNTER — Ambulatory Visit: Payer: Self-pay | Admitting: Oncology

## 2009-06-05 LAB — CBC WITH DIFFERENTIAL/PLATELET
MCH: 31 pg (ref 27.2–33.4)
MCV: 93.7 fL (ref 79.3–98.0)
RBC: 3.94 10*6/uL — ABNORMAL LOW (ref 4.20–5.82)
RDW: 17.7 % — ABNORMAL HIGH (ref 11.0–14.6)

## 2009-06-05 LAB — MANUAL DIFFERENTIAL
EOS: 0 % (ref 0–7)
MONO: 0 % (ref 0–14)
Metamyelocytes: 0 % (ref 0–0)
Myelocytes: 0 % (ref 0–0)
Other Cell: 0 % (ref 0–0)
SEG: 2 % — ABNORMAL LOW (ref 38–77)

## 2009-06-06 LAB — COMPREHENSIVE METABOLIC PANEL
AST: 18 U/L (ref 0–37)
Albumin: 4 g/dL (ref 3.5–5.2)
Alkaline Phosphatase: 118 U/L — ABNORMAL HIGH (ref 39–117)
BUN: 20 mg/dL (ref 6–23)
Potassium: 3.5 mEq/L (ref 3.5–5.3)
Total Bilirubin: 0.7 mg/dL (ref 0.3–1.2)

## 2009-06-06 LAB — BETA 2 MICROGLOBULIN, SERUM: Beta-2 Microglobulin: 4.43 mg/L — ABNORMAL HIGH (ref 1.01–1.73)

## 2009-07-11 ENCOUNTER — Encounter (INDEPENDENT_AMBULATORY_CARE_PROVIDER_SITE_OTHER): Payer: Self-pay | Admitting: *Deleted

## 2009-07-29 ENCOUNTER — Ambulatory Visit: Payer: Self-pay | Admitting: Oncology

## 2009-07-31 LAB — CBC WITH DIFFERENTIAL/PLATELET
BASO%: 0 % (ref 0.0–2.0)
EOS%: 0.1 % (ref 0.0–7.0)
HCT: 31.8 % — ABNORMAL LOW (ref 38.4–49.9)
MCH: 32.7 pg (ref 27.2–33.4)
MCHC: 32 g/dL (ref 32.0–36.0)
NEUT%: 11.4 % — ABNORMAL LOW (ref 39.0–75.0)
lymph#: 163.3 10*3/uL — ABNORMAL HIGH (ref 0.9–3.3)

## 2009-07-31 LAB — MORPHOLOGY

## 2009-08-01 LAB — COMPREHENSIVE METABOLIC PANEL
AST: 17 U/L (ref 0–37)
BUN: 16 mg/dL (ref 6–23)
CO2: 24 mEq/L (ref 19–32)
Calcium: 8.6 mg/dL (ref 8.4–10.5)
Chloride: 110 mEq/L (ref 96–112)
Creatinine, Ser: 1 mg/dL (ref 0.40–1.50)

## 2009-08-01 LAB — LACTATE DEHYDROGENASE: LDH: 224 U/L (ref 94–250)

## 2009-08-01 LAB — BETA 2 MICROGLOBULIN, SERUM: Beta-2 Microglobulin: 3.99 mg/L — ABNORMAL HIGH (ref 1.01–1.73)

## 2009-08-14 LAB — CBC WITH DIFFERENTIAL/PLATELET
Eosinophils Absolute: 0.2 10*3/uL (ref 0.0–0.5)
MONO#: 3.9 10*3/uL — ABNORMAL HIGH (ref 0.1–0.9)
NEUT#: 10.7 10*3/uL — ABNORMAL HIGH (ref 1.5–6.5)
RBC: 2.96 10*6/uL — ABNORMAL LOW (ref 4.20–5.82)
RDW: 20.2 % — ABNORMAL HIGH (ref 11.0–14.6)
WBC: 190.7 10*3/uL (ref 4.0–10.3)
lymph#: 175.8 10*3/uL — ABNORMAL HIGH (ref 0.9–3.3)

## 2009-08-20 LAB — MANUAL DIFFERENTIAL
ALC: 215.4 10*3/uL — ABNORMAL HIGH (ref 0.9–3.3)
ANC (CHCC manual diff): 6.7 10*3/uL — ABNORMAL HIGH (ref 1.5–6.5)
Blasts: 0 % (ref 0–0)
LYMPH: 97 % — ABNORMAL HIGH (ref 14–49)
Metamyelocytes: 0 % (ref 0–0)
Other Cell: 0 % (ref 0–0)
SEG: 3 % — ABNORMAL LOW (ref 38–77)
Variant Lymph: 0 % (ref 0–0)

## 2009-08-20 LAB — PROTIME-INR: Protime: 19.2 Seconds — ABNORMAL HIGH (ref 10.6–13.4)

## 2009-08-20 LAB — CBC WITH DIFFERENTIAL/PLATELET
HCT: 29 % — ABNORMAL LOW (ref 38.4–49.9)
HGB: 9.1 g/dL — ABNORMAL LOW (ref 13.0–17.1)
MCH: 33.2 pg (ref 27.2–33.4)
MCV: 106.3 fL — ABNORMAL HIGH (ref 79.3–98.0)
Platelets: 20 10*3/uL — ABNORMAL LOW (ref 140–400)

## 2009-08-27 LAB — CBC WITH DIFFERENTIAL/PLATELET
HGB: 8.4 g/dL — ABNORMAL LOW (ref 13.0–17.1)
RDW: 22.1 % — ABNORMAL HIGH (ref 11.0–14.6)
WBC: 200.6 10*3/uL (ref 4.0–10.3)
nRBC: 0 % (ref 0–0)

## 2009-08-27 LAB — MANUAL DIFFERENTIAL
ALC: 198.6 10*3/uL — ABNORMAL HIGH (ref 0.9–3.3)
Blasts: 0 % (ref 0–0)
Myelocytes: 0 % (ref 0–0)
Other Cell: 0 % (ref 0–0)
PROMYELO: 0 % (ref 0–0)
SEG: 1 % — ABNORMAL LOW (ref 38–77)
Variant Lymph: 0 % (ref 0–0)

## 2009-08-27 LAB — PROTIME-INR
INR: 2.6 (ref 2.00–3.50)
Protime: 31.2 Seconds — ABNORMAL HIGH (ref 10.6–13.4)

## 2009-09-01 ENCOUNTER — Ambulatory Visit: Payer: Self-pay | Admitting: Oncology

## 2009-09-03 LAB — TECHNOLOGIST REVIEW

## 2009-09-03 LAB — COMPREHENSIVE METABOLIC PANEL
AST: 28 U/L (ref 0–37)
Albumin: 3.8 g/dL (ref 3.5–5.2)
Alkaline Phosphatase: 133 U/L — ABNORMAL HIGH (ref 39–117)
BUN: 14 mg/dL (ref 6–23)
Creatinine, Ser: 0.94 mg/dL (ref 0.40–1.50)
Glucose, Bld: 122 mg/dL — ABNORMAL HIGH (ref 70–99)
Potassium: 4.8 mEq/L (ref 3.5–5.3)

## 2009-09-03 LAB — CBC WITH DIFFERENTIAL/PLATELET
BASO%: 0 % (ref 0.0–2.0)
EOS%: 0 % (ref 0.0–7.0)
HCT: 28.2 % — ABNORMAL LOW (ref 38.4–49.9)
LYMPH%: 91.5 % — ABNORMAL HIGH (ref 14.0–49.0)
MCH: 33.8 pg — ABNORMAL HIGH (ref 27.2–33.4)
MCHC: 31.7 g/dL — ABNORMAL LOW (ref 32.0–36.0)
MCV: 106.5 fL — ABNORMAL HIGH (ref 79.3–98.0)
MONO%: 0.2 % (ref 0.0–14.0)
NEUT%: 8.3 % — ABNORMAL LOW (ref 39.0–75.0)
Platelets: 14 10*3/uL — ABNORMAL LOW (ref 140–400)
RBC: 2.65 10*6/uL — ABNORMAL LOW (ref 4.20–5.82)
WBC: 227.3 10*3/uL (ref 4.0–10.3)

## 2009-09-03 LAB — PROTIME-INR: Protime: 12 Seconds (ref 10.6–13.4)

## 2009-09-05 ENCOUNTER — Inpatient Hospital Stay (HOSPITAL_COMMUNITY): Admission: EM | Admit: 2009-09-05 | Discharge: 2009-09-09 | Payer: Self-pay | Admitting: Emergency Medicine

## 2009-09-05 ENCOUNTER — Ambulatory Visit: Payer: Self-pay | Admitting: Cardiology

## 2009-09-06 ENCOUNTER — Ambulatory Visit: Payer: Self-pay | Admitting: Oncology

## 2009-09-08 ENCOUNTER — Encounter (HOSPITAL_BASED_OUTPATIENT_CLINIC_OR_DEPARTMENT_OTHER): Payer: Self-pay | Admitting: Internal Medicine

## 2009-09-10 ENCOUNTER — Encounter (HOSPITAL_COMMUNITY): Admission: RE | Admit: 2009-09-10 | Discharge: 2009-09-18 | Payer: Self-pay | Admitting: Oncology

## 2009-09-10 LAB — CBC WITH DIFFERENTIAL/PLATELET
BASO%: 0 % (ref 0.0–2.0)
LYMPH%: 96.8 % — ABNORMAL HIGH (ref 14.0–49.0)
MCHC: 30.9 g/dL — ABNORMAL LOW (ref 32.0–36.0)
MONO#: 1 10*3/uL — ABNORMAL HIGH (ref 0.1–0.9)
Platelets: 26 10*3/uL — ABNORMAL LOW (ref 140–400)
RBC: 2.36 10*6/uL — ABNORMAL LOW (ref 4.20–5.82)
RDW: 18.8 % — ABNORMAL HIGH (ref 11.0–14.6)
WBC: 230.4 10*3/uL (ref 4.0–10.3)
lymph#: 223 10*3/uL — ABNORMAL HIGH (ref 0.9–3.3)

## 2009-09-10 LAB — PROTIME-INR
INR: 1 — ABNORMAL LOW (ref 2.00–3.50)
Protime: 12 Seconds (ref 10.6–13.4)

## 2009-09-10 LAB — COMPREHENSIVE METABOLIC PANEL
ALT: 12 U/L (ref 0–53)
CO2: 21 mEq/L (ref 19–32)
Creatinine, Ser: 0.81 mg/dL (ref 0.40–1.50)
Total Bilirubin: 0.6 mg/dL (ref 0.3–1.2)

## 2009-09-10 LAB — LACTATE DEHYDROGENASE: LDH: 198 U/L (ref 94–250)

## 2009-09-10 LAB — TECHNOLOGIST REVIEW

## 2009-09-11 LAB — TYPE & CROSSMATCH - CHCC

## 2009-09-17 ENCOUNTER — Ambulatory Visit: Payer: Self-pay | Admitting: Oncology

## 2009-09-17 LAB — MANUAL DIFFERENTIAL
ALC: 172 10*3/uL — ABNORMAL HIGH (ref 0.9–3.3)
LYMPH: 99 % — ABNORMAL HIGH (ref 14–49)
MONO: 0 % (ref 0–14)
Metamyelocytes: 0 % (ref 0–0)
Other Cell: 0 % (ref 0–0)
RBC Comments: NORMAL
SEG: 1 % — ABNORMAL LOW (ref 38–77)
Variant Lymph: 0 % (ref 0–0)

## 2009-09-17 LAB — COMPREHENSIVE METABOLIC PANEL
ALT: <8 U/L (ref 0–53)
Albumin: 4.2 g/dL (ref 3.5–5.2)
CO2: 26 mEq/L (ref 19–32)
Calcium: 9.2 mg/dL (ref 8.4–10.5)
Chloride: 106 mEq/L (ref 96–112)
Potassium: 4.1 mEq/L (ref 3.5–5.3)
Sodium: 141 mEq/L (ref 135–145)
Total Bilirubin: 0.7 mg/dL (ref 0.3–1.2)
Total Protein: 5.9 g/dL — ABNORMAL LOW (ref 6.0–8.3)

## 2009-09-17 LAB — LACTATE DEHYDROGENASE: LDH: 127 U/L (ref 94–250)

## 2009-09-17 LAB — CBC WITH DIFFERENTIAL/PLATELET
MCV: 97.4 fL (ref 79.3–98.0)
Platelets: 9 10*3/uL — CL (ref 140–400)
RBC: 3.02 10*6/uL — ABNORMAL LOW (ref 4.20–5.82)
WBC: 173.8 10*3/uL (ref 4.0–10.3)

## 2009-09-19 ENCOUNTER — Encounter (HOSPITAL_COMMUNITY): Admission: RE | Admit: 2009-09-19 | Discharge: 2009-12-18 | Payer: Self-pay | Admitting: Oncology

## 2009-09-19 LAB — MANUAL DIFFERENTIAL
ALC: 141.7 10*3/uL — ABNORMAL HIGH (ref 0.9–3.3)
ANC (CHCC manual diff): 0 10*3/uL — CL (ref 1.5–6.5)
Blasts: 0 % (ref 0–0)
LYMPH: 100 % — ABNORMAL HIGH (ref 14–49)
MONO: 0 % (ref 0–14)
Metamyelocytes: 0 % (ref 0–0)
Other Cell: 0 % (ref 0–0)
SEG: 0 % — ABNORMAL LOW (ref 38–77)
Variant Lymph: 0 % (ref 0–0)

## 2009-09-19 LAB — CBC WITH DIFFERENTIAL/PLATELET
HCT: 28.1 % — ABNORMAL LOW (ref 38.4–49.9)
HGB: 8.6 g/dL — ABNORMAL LOW (ref 13.0–17.1)
MCV: 98.9 fL — ABNORMAL HIGH (ref 79.3–98.0)
Platelets: 9 10*3/uL — CL (ref 140–400)
RBC: 2.84 10*6/uL — ABNORMAL LOW (ref 4.20–5.82)
WBC: 141.7 10*3/uL (ref 4.0–10.3)

## 2009-09-19 LAB — COMPREHENSIVE METABOLIC PANEL
Albumin: 3 g/dL — ABNORMAL LOW (ref 3.5–5.2)
BUN: 11 mg/dL (ref 6–23)
Calcium: 8.8 mg/dL (ref 8.4–10.5)
Chloride: 105 mEq/L (ref 96–112)
Creatinine, Ser: 0.83 mg/dL (ref 0.40–1.50)
Glucose, Bld: 168 mg/dL — ABNORMAL HIGH (ref 70–99)
Potassium: 3.9 mEq/L (ref 3.5–5.3)

## 2009-09-23 LAB — CBC WITH DIFFERENTIAL/PLATELET
Basophils Absolute: 0.1 10*3/uL (ref 0.0–0.1)
Eosinophils Absolute: 0.1 10*3/uL (ref 0.0–0.5)
HCT: 27.5 % — ABNORMAL LOW (ref 38.4–49.9)
HGB: 8.8 g/dL — ABNORMAL LOW (ref 13.0–17.1)
MONO#: 1.6 10*3/uL — ABNORMAL HIGH (ref 0.1–0.9)
NEUT#: 2.7 10*3/uL (ref 1.5–6.5)
RDW: 19 % — ABNORMAL HIGH (ref 11.0–14.6)
lymph#: 141.8 10*3/uL — ABNORMAL HIGH (ref 0.9–3.3)

## 2009-09-23 LAB — COMPREHENSIVE METABOLIC PANEL
ALT: 14 U/L (ref 0–53)
Albumin: 3.2 g/dL — ABNORMAL LOW (ref 3.5–5.2)
CO2: 26 mEq/L (ref 19–32)
Calcium: 9 mg/dL (ref 8.4–10.5)
Chloride: 103 mEq/L (ref 96–112)
Potassium: 4.5 mEq/L (ref 3.5–5.3)
Sodium: 136 mEq/L (ref 135–145)
Total Bilirubin: 0.7 mg/dL (ref 0.3–1.2)
Total Protein: 6.3 g/dL (ref 6.0–8.3)

## 2009-09-23 LAB — PROTHROMBIN TIME: Prothrombin Time: 12.8 seconds (ref 11.6–15.2)

## 2009-09-23 LAB — LACTATE DEHYDROGENASE: LDH: 105 U/L (ref 94–250)

## 2009-10-01 LAB — CBC WITH DIFFERENTIAL/PLATELET
BASO%: 0.1 % (ref 0.0–2.0)
EOS%: 0 % (ref 0.0–7.0)
Eosinophils Absolute: 0 10*3/uL (ref 0.0–0.5)
LYMPH%: 92.5 % — ABNORMAL HIGH (ref 14.0–49.0)
MCHC: 32.6 g/dL (ref 32.0–36.0)
MCV: 99.3 fL — ABNORMAL HIGH (ref 79.3–98.0)
MONO%: 1 % (ref 0.0–14.0)
NEUT#: 9.7 10*3/uL — ABNORMAL HIGH (ref 1.5–6.5)
Platelets: 11 10*3/uL — ABNORMAL LOW (ref 140–400)
RBC: 2.43 10*6/uL — ABNORMAL LOW (ref 4.20–5.82)
RDW: 19.1 % — ABNORMAL HIGH (ref 11.0–14.6)

## 2009-10-01 LAB — COMPREHENSIVE METABOLIC PANEL
Albumin: 3.1 g/dL — ABNORMAL LOW (ref 3.5–5.2)
Alkaline Phosphatase: 111 U/L (ref 39–117)
BUN: 15 mg/dL (ref 6–23)
CO2: 23 mEq/L (ref 19–32)
Glucose, Bld: 173 mg/dL — ABNORMAL HIGH (ref 70–99)
Potassium: 3.9 mEq/L (ref 3.5–5.3)
Total Bilirubin: 0.5 mg/dL (ref 0.3–1.2)
Total Protein: 5.7 g/dL — ABNORMAL LOW (ref 6.0–8.3)

## 2009-10-01 LAB — PROTIME-INR
INR: 1 — ABNORMAL LOW (ref 2.00–3.50)
Protime: 12 Seconds (ref 10.6–13.4)

## 2009-10-01 LAB — LACTATE DEHYDROGENASE: LDH: 131 U/L (ref 94–250)

## 2009-10-01 LAB — TECHNOLOGIST REVIEW

## 2009-10-01 LAB — TYPE & CROSSMATCH - CHCC

## 2009-10-03 ENCOUNTER — Ambulatory Visit: Payer: Self-pay | Admitting: Hematology & Oncology

## 2009-10-03 ENCOUNTER — Inpatient Hospital Stay (HOSPITAL_COMMUNITY): Admission: EM | Admit: 2009-10-03 | Discharge: 2009-10-09 | Payer: Self-pay | Admitting: Emergency Medicine

## 2009-10-04 ENCOUNTER — Ambulatory Visit: Payer: Self-pay | Admitting: Oncology

## 2009-10-09 LAB — CULTURE, BLOOD (SINGLE)

## 2009-10-10 ENCOUNTER — Ambulatory Visit: Payer: Self-pay | Admitting: Oncology

## 2009-10-15 LAB — COMPREHENSIVE METABOLIC PANEL
AST: 18 U/L (ref 0–37)
Alkaline Phosphatase: 93 U/L (ref 39–117)
BUN: 17 mg/dL (ref 6–23)
Glucose, Bld: 112 mg/dL — ABNORMAL HIGH (ref 70–99)
Sodium: 135 mEq/L (ref 135–145)
Total Bilirubin: 0.9 mg/dL (ref 0.3–1.2)
Total Protein: 5.6 g/dL — ABNORMAL LOW (ref 6.0–8.3)

## 2009-10-15 LAB — CBC WITH DIFFERENTIAL/PLATELET
Basophils Absolute: 0 10*3/uL (ref 0.0–0.1)
Eosinophils Absolute: 0 10*3/uL (ref 0.0–0.5)
HGB: 13 g/dL (ref 13.0–17.1)
LYMPH%: 94.4 % — ABNORMAL HIGH (ref 14.0–49.0)
MCV: 87.8 fL (ref 79.3–98.0)
MONO%: 0.4 % (ref 0.0–14.0)
NEUT#: 0.3 10*3/uL — CL (ref 1.5–6.5)
Platelets: 10 10*3/uL — ABNORMAL LOW (ref 140–400)

## 2009-10-15 LAB — URINALYSIS, MICROSCOPIC - CHCC
Blood: NEGATIVE
Nitrite: NEGATIVE
Protein: 30 mg/dL
Specific Gravity, Urine: 1.02 (ref 1.003–1.035)
pH: 6.5 (ref 4.6–8.0)

## 2009-10-17 ENCOUNTER — Inpatient Hospital Stay (HOSPITAL_COMMUNITY): Admission: EM | Admit: 2009-10-17 | Discharge: 2009-10-18 | Payer: Self-pay | Admitting: Emergency Medicine

## 2009-10-18 ENCOUNTER — Ambulatory Visit: Payer: Self-pay | Admitting: Oncology

## 2009-10-22 LAB — CBC WITH DIFFERENTIAL/PLATELET
Basophils Absolute: 0 10*3/uL (ref 0.0–0.1)
Eosinophils Absolute: 0 10*3/uL (ref 0.0–0.5)
HCT: 29.6 % — ABNORMAL LOW (ref 38.4–49.9)
LYMPH%: 79.8 % — ABNORMAL HIGH (ref 14.0–49.0)
MCV: 88.7 fL (ref 79.3–98.0)
MONO#: 0 10*3/uL — ABNORMAL LOW (ref 0.1–0.9)
MONO%: 0.9 % (ref 0.0–14.0)
NEUT#: 0.3 10*3/uL — CL (ref 1.5–6.5)
NEUT%: 18.7 % — ABNORMAL LOW (ref 39.0–75.0)
Platelets: 11 10*3/uL — ABNORMAL LOW (ref 140–400)
RBC: 3.34 10*6/uL — ABNORMAL LOW (ref 4.20–5.82)
WBC: 1.5 10*3/uL — ABNORMAL LOW (ref 4.0–10.3)

## 2009-10-22 LAB — COMPREHENSIVE METABOLIC PANEL
ALT: 21 U/L (ref 0–53)
BUN: 12 mg/dL (ref 6–23)
CO2: 24 mEq/L (ref 19–32)
Calcium: 8.8 mg/dL (ref 8.4–10.5)
Chloride: 102 mEq/L (ref 96–112)
Creatinine, Ser: 0.61 mg/dL (ref 0.40–1.50)
Glucose, Bld: 138 mg/dL — ABNORMAL HIGH (ref 70–99)
Total Bilirubin: 1.2 mg/dL (ref 0.3–1.2)

## 2009-12-01 ENCOUNTER — Inpatient Hospital Stay (HOSPITAL_COMMUNITY): Admission: AD | Admit: 2009-12-01 | Discharge: 2009-12-08 | Payer: Self-pay | Admitting: Oncology

## 2009-12-01 ENCOUNTER — Ambulatory Visit: Payer: Self-pay | Admitting: Oncology

## 2009-12-06 ENCOUNTER — Ambulatory Visit: Payer: Self-pay | Admitting: Hematology & Oncology

## 2010-01-20 DEATH — deceased

## 2011-03-23 LAB — CLOSTRIDIUM DIFFICILE EIA

## 2011-03-25 LAB — CBC
HCT: 21.1 % — ABNORMAL LOW (ref 39.0–52.0)
HCT: 23.1 % — ABNORMAL LOW (ref 39.0–52.0)
HCT: 24 % — ABNORMAL LOW (ref 39.0–52.0)
HCT: 26 % — ABNORMAL LOW (ref 39.0–52.0)
HCT: 26.7 % — ABNORMAL LOW (ref 39.0–52.0)
HCT: 27.8 % — ABNORMAL LOW (ref 39.0–52.0)
HCT: 28.4 % — ABNORMAL LOW (ref 39.0–52.0)
HCT: 28.6 % — ABNORMAL LOW (ref 39.0–52.0)
HCT: 33.7 % — ABNORMAL LOW (ref 39.0–52.0)
Hemoglobin: 7.3 g/dL — CL (ref 13.0–17.0)
Hemoglobin: 7.7 g/dL — CL (ref 13.0–17.0)
Hemoglobin: 8.6 g/dL — ABNORMAL LOW (ref 13.0–17.0)
Hemoglobin: 9.9 g/dL — ABNORMAL LOW (ref 13.0–17.0)
Hemoglobin: 9.9 g/dL — ABNORMAL LOW (ref 13.0–17.0)
MCHC: 33.4 g/dL (ref 30.0–36.0)
MCHC: 34.3 g/dL (ref 30.0–36.0)
MCHC: 34.8 g/dL (ref 30.0–36.0)
MCHC: 32.9 g/dL (ref 30.0–36.0)
MCV: 88.2 fL (ref 78.0–100.0)
MCV: 88.7 fL (ref 78.0–100.0)
MCV: 88.7 fL (ref 78.0–100.0)
MCV: 94.5 fL (ref 78.0–100.0)
MCV: 94.9 fL (ref 78.0–100.0)
MCV: 95.6 fL (ref 78.0–100.0)
MCV: 98.8 fL (ref 78.0–100.0)
MCV: 98 fL (ref 78.0–100.0)
Platelets: 13 10*3/uL — CL (ref 150–400)
Platelets: 22 10*3/uL — CL (ref 150–400)
Platelets: 24 10*3/uL — CL (ref 150–400)
Platelets: 6 10*3/uL — CL (ref 150–400)
Platelets: 9 10*3/uL — CL (ref 150–400)
Platelets: 9 10*3/uL — CL (ref 150–400)
Platelets: 19 10*3/uL — CL (ref 150–400)
RBC: 2.17 MIL/uL — ABNORMAL LOW (ref 4.22–5.81)
RBC: 2.54 MIL/uL — ABNORMAL LOW (ref 4.22–5.81)
RBC: 2.64 MIL/uL — ABNORMAL LOW (ref 4.22–5.81)
RBC: 2.91 MIL/uL — ABNORMAL LOW (ref 4.22–5.81)
RBC: 3.25 MIL/uL — ABNORMAL LOW (ref 4.22–5.81)
RBC: 3.8 MIL/uL — ABNORMAL LOW (ref 4.22–5.81)
RDW: 15.8 % — ABNORMAL HIGH (ref 11.5–15.5)
RDW: 16.6 % — ABNORMAL HIGH (ref 11.5–15.5)
RDW: 17 % — ABNORMAL HIGH (ref 11.5–15.5)
WBC: 1.4 10*3/uL — CL (ref 4.0–10.5)
WBC: 105.2 10*3/uL (ref 4.0–10.5)
WBC: 129 10*3/uL (ref 4.0–10.5)
WBC: 25.7 10*3/uL — ABNORMAL HIGH (ref 4.0–10.5)
WBC: 78.9 10*3/uL (ref 4.0–10.5)
WBC: 146.2 10*3/uL (ref 4.0–10.5)

## 2011-03-25 LAB — COMPREHENSIVE METABOLIC PANEL
ALT: 14 U/L (ref 0–53)
ALT: 41 U/L (ref 0–53)
AST: 17 U/L (ref 0–37)
AST: 28 U/L (ref 0–37)
Albumin: 2.1 g/dL — ABNORMAL LOW (ref 3.5–5.2)
Alkaline Phosphatase: 115 U/L (ref 39–117)
Alkaline Phosphatase: 138 U/L — ABNORMAL HIGH (ref 39–117)
Alkaline Phosphatase: 75 U/L (ref 39–117)
Alkaline Phosphatase: 93 U/L (ref 39–117)
BUN: 10 mg/dL (ref 6–23)
BUN: 13 mg/dL (ref 6–23)
BUN: 13 mg/dL (ref 6–23)
BUN: 17 mg/dL (ref 6–23)
CO2: 22 mEq/L (ref 19–32)
CO2: 25 mEq/L (ref 19–32)
CO2: 25 mEq/L (ref 19–32)
CO2: 23 mEq/L (ref 19–32)
Calcium: 8.2 mg/dL — ABNORMAL LOW (ref 8.4–10.5)
Calcium: 8.8 mg/dL (ref 8.4–10.5)
Chloride: 104 mEq/L (ref 96–112)
Chloride: 107 mEq/L (ref 96–112)
Creatinine, Ser: 0.53 mg/dL (ref 0.4–1.5)
Creatinine, Ser: 0.58 mg/dL (ref 0.4–1.5)
Creatinine, Ser: 0.62 mg/dL (ref 0.4–1.5)
Creatinine, Ser: 0.85 mg/dL (ref 0.4–1.5)
GFR calc Af Amer: >60 mL/min (ref >60)
GFR calc Af Amer: >60 mL/min (ref >60)
GFR calc Af Amer: >60 mL/min (ref >60)
GFR calc Af Amer: >60 mL/min (ref >60)
GFR calc non Af Amer: >60 mL/min (ref >60)
GFR calc non Af Amer: >60 mL/min (ref >60)
GFR calc non Af Amer: >60 mL/min (ref >60)
GFR calc non Af Amer: >60 mL/min (ref >60)
Glucose, Bld: 123 mg/dL — ABNORMAL HIGH (ref 70–99)
Glucose, Bld: 124 mg/dL — ABNORMAL HIGH (ref 70–99)
Glucose, Bld: 118 mg/dL — ABNORMAL HIGH (ref 70–99)
Potassium: 3.3 mEq/L — ABNORMAL LOW (ref 3.5–5.1)
Potassium: 3.3 mEq/L — ABNORMAL LOW (ref 3.5–5.1)
Potassium: 3.5 mEq/L (ref 3.5–5.1)
Potassium: 3.9 mEq/L (ref 3.5–5.1)
Sodium: 134 mEq/L — ABNORMAL LOW (ref 135–145)
Sodium: 136 mEq/L (ref 135–145)
Total Bilirubin: 0.5 mg/dL (ref 0.3–1.2)
Total Bilirubin: 0.9 mg/dL (ref 0.3–1.2)
Total Bilirubin: 1.3 mg/dL — ABNORMAL HIGH (ref 0.3–1.2)
Total Protein: 5 g/dL — ABNORMAL LOW (ref 6.0–8.3)
Total Protein: 5.2 g/dL — ABNORMAL LOW (ref 6.0–8.3)

## 2011-03-25 LAB — BASIC METABOLIC PANEL
BUN: 13 mg/dL (ref 6–23)
CO2: 23 mEq/L (ref 19–32)
Chloride: 106 mEq/L (ref 96–112)
Chloride: 107 mEq/L (ref 96–112)
Creatinine, Ser: 0.71 mg/dL (ref 0.4–1.5)
GFR calc Af Amer: >60 mL/min (ref >60)
GFR calc Af Amer: >60 mL/min (ref >60)
GFR calc non Af Amer: >60 mL/min (ref >60)
Potassium: 3.7 mEq/L (ref 3.5–5.1)
Potassium: 4.2 mEq/L (ref 3.5–5.1)
Sodium: 135 mEq/L (ref 135–145)
Sodium: 136 mEq/L (ref 135–145)

## 2011-03-25 LAB — CROSSMATCH
ABO/RH(D): O NEG
ABO/RH(D): O NEG
Antibody Screen: NEGATIVE

## 2011-03-25 LAB — DIFFERENTIAL
Basophils Absolute: 0 10*3/uL (ref 0.0–0.1)
Basophils Absolute: 0 10*3/uL (ref 0.0–0.1)
Basophils Absolute: 0 10*3/uL (ref 0.0–0.1)
Basophils Absolute: 0 10*3/uL (ref 0.0–0.1)
Basophils Relative: 0 % (ref 0–1)
Basophils Relative: 0 % (ref 0–1)
Basophils Relative: 1 % (ref 0–1)
Blasts: 16 %
Eosinophils Absolute: 0 10*3/uL (ref 0.0–0.7)
Eosinophils Absolute: 0 10*3/uL (ref 0.0–0.7)
Eosinophils Relative: 0 % (ref 0–5)
Eosinophils Relative: 0 % (ref 0–5)
Lymphocytes Relative: 88 % — ABNORMAL HIGH (ref 12–46)
Lymphocytes Relative: 90 % — ABNORMAL HIGH (ref 12–46)
Lymphocytes Relative: 99 % — ABNORMAL HIGH (ref 12–46)
Lymphocytes Relative: 84 % — ABNORMAL HIGH (ref 12–46)
Lymphs Abs: 1.7 10*3/uL (ref 0.7–4.0)
Lymphs Abs: 25.4 10*3/uL — ABNORMAL HIGH (ref 0.7–4.0)
Lymphs Abs: 122.8 10*3/uL — ABNORMAL HIGH (ref 0.7–4.0)
Monocytes Absolute: 0 10*3/uL — ABNORMAL LOW (ref 0.1–1.0)
Monocytes Absolute: 0 10*3/uL — ABNORMAL LOW (ref 0.1–1.0)
Monocytes Absolute: 0 10*3/uL — ABNORMAL LOW (ref 0.1–1.0)
Monocytes Relative: 0 % — ABNORMAL LOW (ref 3–12)
Monocytes Relative: 0 % — ABNORMAL LOW (ref 3–12)
Monocytes Relative: 0 % — ABNORMAL LOW (ref 3–12)
Monocytes Relative: 0 % — ABNORMAL LOW (ref 3–12)
Neutro Abs: 0.3 10*3/uL — ABNORMAL LOW (ref 1.7–7.7)
Neutro Abs: 0.8 10*3/uL — ABNORMAL LOW (ref 1.7–7.7)
Neutrophils Relative %: 1 % — ABNORMAL LOW (ref 43–77)
Neutrophils Relative %: 11 % — ABNORMAL LOW (ref 43–77)
Neutrophils Relative %: 0 % — ABNORMAL LOW (ref 43–77)
nRBC: 0 /100 WBC

## 2011-03-25 LAB — LACTATE DEHYDROGENASE: LDH: 117 U/L (ref 94–250)

## 2011-03-25 LAB — CK TOTAL AND CKMB (NOT AT ARMC): Relative Index: INVALID (ref 0.0–2.5)

## 2011-03-25 LAB — URINALYSIS, ROUTINE W REFLEX MICROSCOPIC
Bilirubin Urine: NEGATIVE
Bilirubin Urine: NEGATIVE
Bilirubin Urine: NEGATIVE
Hgb urine dipstick: NEGATIVE
Ketones, ur: NEGATIVE mg/dL
Leukocytes, UA: NEGATIVE
Nitrite: NEGATIVE
Nitrite: NEGATIVE
Specific Gravity, Urine: 1.026 (ref 1.005–1.030)
Specific Gravity, Urine: 1.031 — ABNORMAL HIGH (ref 1.005–1.030)
Urobilinogen, UA: 0.2 mg/dL (ref 0.0–1.0)
Urobilinogen, UA: 1 mg/dL (ref 0.0–1.0)
pH: 7 (ref 5.0–8.0)

## 2011-03-25 LAB — PREPARE PLATELETS

## 2011-03-25 LAB — URINE MICROSCOPIC-ADD ON

## 2011-03-25 LAB — SAMPLE TO BLOOD BANK

## 2011-03-25 LAB — CULTURE, BLOOD (ROUTINE X 2): Culture: NO GROWTH

## 2011-03-25 LAB — URIC ACID
Uric Acid, Serum: 2.6 mg/dL — ABNORMAL LOW (ref 4.0–7.8)
Uric Acid, Serum: 2.7 mg/dL — ABNORMAL LOW (ref 4.0–7.8)

## 2011-03-25 LAB — TYPE AND SCREEN: ABO/RH(D): O NEG

## 2011-03-25 LAB — URINE CULTURE: Colony Count: NO GROWTH

## 2011-03-25 LAB — PROTIME-INR
INR: 1.06 (ref 0.00–1.49)
Prothrombin Time: 13.7 seconds (ref 11.6–15.2)

## 2011-03-25 LAB — PREPARE RBC (CROSSMATCH)

## 2011-03-26 LAB — COMPREHENSIVE METABOLIC PANEL WITH GFR
ALT: 11 U/L (ref 0–53)
AST: 17 U/L (ref 0–37)
Albumin: 3.3 g/dL — ABNORMAL LOW (ref 3.5–5.2)
Alkaline Phosphatase: 101 U/L (ref 39–117)
BUN: 14 mg/dL (ref 6–23)
CO2: 23 meq/L (ref 19–32)
Calcium: 8.3 mg/dL — ABNORMAL LOW (ref 8.4–10.5)
Chloride: 109 meq/L (ref 96–112)
Creatinine, Ser: 0.77 mg/dL (ref 0.4–1.5)
GFR calc Af Amer: >60 mL/min (ref >60)
GFR calc non Af Amer: >60 mL/min (ref >60)
Glucose, Bld: 122 mg/dL — ABNORMAL HIGH (ref 70–99)
Potassium: 4 meq/L (ref 3.5–5.1)
Sodium: 138 meq/L (ref 135–145)
Total Bilirubin: 0.7 mg/dL (ref 0.3–1.2)
Total Protein: 5.1 g/dL — ABNORMAL LOW (ref 6.0–8.3)

## 2011-03-26 LAB — DIFFERENTIAL
Band Neutrophils: 0 % (ref 0–10)
Basophils Absolute: 0 K/uL (ref 0.0–0.1)
Basophils Absolute: 0 K/uL (ref 0.0–0.1)
Basophils Relative: 0 % (ref 0–1)
Basophils Relative: 0 % (ref 0–1)
Basophils Relative: 0 % (ref 0–1)
Blasts: 0 %
Eosinophils Absolute: 0 K/uL (ref 0.0–0.7)
Eosinophils Absolute: 0 K/uL (ref 0.0–0.7)
Eosinophils Relative: 0 % (ref 0–5)
Eosinophils Relative: 0 % (ref 0–5)
Eosinophils Relative: 0 % (ref 0–5)
Lymphocytes Relative: 86 % — ABNORMAL HIGH (ref 12–46)
Lymphocytes Relative: 95 % — ABNORMAL HIGH (ref 12–46)
Lymphs Abs: 154.3 K/uL — ABNORMAL HIGH (ref 0.7–4.0)
Lymphs Abs: 163.8 K/uL — ABNORMAL HIGH (ref 0.7–4.0)
Lymphs Abs: 177.6 10*3/uL — ABNORMAL HIGH (ref 0.7–4.0)
Metamyelocytes Relative: 0 %
Monocytes Absolute: 1.8 K/uL — ABNORMAL HIGH (ref 0.1–1.0)
Monocytes Absolute: 3.5 K/uL — ABNORMAL HIGH (ref 0.1–1.0)
Monocytes Absolute: 3.8 10*3/uL — ABNORMAL HIGH (ref 0.1–1.0)
Monocytes Relative: 1 % — ABNORMAL LOW (ref 3–12)
Monocytes Relative: 2 % — ABNORMAL LOW (ref 3–12)
Myelocytes: 0 %
Neutro Abs: 23.3 K/uL — ABNORMAL HIGH (ref 1.7–7.7)
Neutro Abs: 5.2 K/uL (ref 1.7–7.7)
Neutro Abs: 9.6 10*3/uL — ABNORMAL HIGH (ref 1.7–7.7)
Neutrophils Relative %: 13 % — ABNORMAL LOW (ref 43–77)
Neutrophils Relative %: 3 % — ABNORMAL LOW (ref 43–77)
Promyelocytes Absolute: 0 %
nRBC: 0 /100 WBC

## 2011-03-26 LAB — PROTIME-INR
INR: 1 (ref 0.00–1.49)
INR: 1.1 (ref 0.00–1.49)
INR: 1.1 (ref 0.00–1.49)
Prothrombin Time: 13.1 seconds (ref 11.6–15.2)
Prothrombin Time: 14 seconds (ref 11.6–15.2)
Prothrombin Time: 14.2 s (ref 11.6–15.2)
Prothrombin Time: 14.2 seconds (ref 11.6–15.2)

## 2011-03-26 LAB — CROSSMATCH
ABO/RH(D): O NEG
Antibody Screen: NEGATIVE

## 2011-03-26 LAB — CBC
HCT: 22.8 % — ABNORMAL LOW (ref 39.0–52.0)
HCT: 23.5 % — ABNORMAL LOW (ref 39.0–52.0)
HCT: 25.7 % — ABNORMAL LOW (ref 39.0–52.0)
Hemoglobin: 7.1 g/dL — CL (ref 13.0–17.0)
Hemoglobin: 7.4 g/dL — CL (ref 13.0–17.0)
Hemoglobin: 7.8 g/dL — CL (ref 13.0–17.0)
Hemoglobin: 8.1 g/dL — ABNORMAL LOW (ref 13.0–17.0)
Hemoglobin: 8.3 g/dL — ABNORMAL LOW (ref 13.0–17.0)
MCHC: 31 g/dL (ref 30.0–36.0)
MCHC: 31.3 g/dL (ref 30.0–36.0)
MCHC: 31.4 g/dL (ref 30.0–36.0)
MCHC: 31.8 g/dL (ref 30.0–36.0)
MCV: 105.5 fL — ABNORMAL HIGH (ref 78.0–100.0)
MCV: 106.5 fL — ABNORMAL HIGH (ref 78.0–100.0)
MCV: 107 fL — ABNORMAL HIGH (ref 78.0–100.0)
MCV: 108 fL — ABNORMAL HIGH (ref 78.0–100.0)
Platelets: 11 K/uL — CL (ref 150–400)
Platelets: 14 K/uL — CL (ref 150–400)
Platelets: 9 K/uL — CL (ref 150–400)
RBC: 2.11 MIL/uL — ABNORMAL LOW (ref 4.22–5.81)
RBC: 2.2 MIL/uL — ABNORMAL LOW (ref 4.22–5.81)
RBC: 2.34 MIL/uL — ABNORMAL LOW (ref 4.22–5.81)
RBC: 2.42 MIL/uL — ABNORMAL LOW (ref 4.22–5.81)
RDW: 19.3 % — ABNORMAL HIGH (ref 11.5–15.5)
RDW: 19.4 % — ABNORMAL HIGH (ref 11.5–15.5)
RDW: 19.9 % — ABNORMAL HIGH (ref 11.5–15.5)
RDW: 20.1 % — ABNORMAL HIGH (ref 11.5–15.5)
WBC: 172.5 K/uL (ref 4.0–10.5)
WBC: 179.4 K/uL (ref 4.0–10.5)
WBC: 191 K/uL (ref 4.0–10.5)
WBC: 202.6 10*3/uL (ref 4.0–10.5)

## 2011-03-26 LAB — COMPREHENSIVE METABOLIC PANEL
ALT: 13 U/L (ref 0–53)
AST: 25 U/L (ref 0–37)
Alkaline Phosphatase: 98 U/L (ref 39–117)
CO2: 23 mEq/L (ref 19–32)
Chloride: 107 mEq/L (ref 96–112)
Creatinine, Ser: 0.86 mg/dL (ref 0.4–1.5)
GFR calc Af Amer: >60 mL/min (ref >60)
GFR calc non Af Amer: >60 mL/min (ref >60)
Potassium: 5.3 mEq/L — ABNORMAL HIGH (ref 3.5–5.1)
Sodium: 136 mEq/L (ref 135–145)
Total Bilirubin: 0.9 mg/dL (ref 0.3–1.2)

## 2011-03-26 LAB — BASIC METABOLIC PANEL WITH GFR
BUN: 17 mg/dL (ref 6–23)
CO2: 22 meq/L (ref 19–32)
Calcium: 8.1 mg/dL — ABNORMAL LOW (ref 8.4–10.5)
Chloride: 110 meq/L (ref 96–112)
Creatinine, Ser: 1 mg/dL (ref 0.4–1.5)
GFR calc Af Amer: >60 mL/min (ref >60)
GFR calc non Af Amer: >60 mL/min (ref >60)
Glucose, Bld: 207 mg/dL — ABNORMAL HIGH (ref 70–99)
Potassium: 4.5 meq/L (ref 3.5–5.1)
Sodium: 140 meq/L (ref 135–145)

## 2011-03-26 LAB — PREPARE PLATELETS

## 2011-03-26 LAB — PATHOLOGIST SMEAR REVIEW

## 2011-03-26 LAB — URIC ACID: Uric Acid, Serum: 3.8 mg/dL — ABNORMAL LOW (ref 4.0–7.8)

## 2011-05-04 NOTE — Discharge Summary (Signed)
NAMEZIGMUND, LINSE                ACCOUNT NO.:  [id number]   MEDICAL RECORD NO.:  [id number]          PATIENT TYPE:  INP   LOCATION:  5152                         FACILITY:  [facility]   PHYSICIAN:  Hewitt Shorts, M.D.DATE OF BIRTH:  March 14, 1931   DATE OF ADMISSION:  08/17/2007  DATE OF DISCHARGE:  08/22/2007                               DISCHARGE SUMMARY   ADMISSION HISTORY AND PHYSICAL EXAMINATION:  This patient is a 77-year-  old man who presented with left arm radiculopathy.  He had a grade 1 to  2 spondylolisthesis at L5-S1, secondary to an L5 pars interarticularis  defect with marked disk space narrowing.  MRI showed severe neural  foraminal stenosis bilaterally and the patient was admitted for a lumbar  decompression arthrodesis.  General examination was unremarkable.  Neurological examination showed good strength and sensation.   HOSPITAL COURSE:  The patient was admitted and underwent an L5 Gill  procedure and bilateral L5-S1 posterolateral arthrodesis with posterior  instrumentation and bone graft.  Following surgery, he did well  initially, but then developed significant right lumbar radicular pain.  He was started on Lyrica 75 mg b.i.d. and we checked x-rays and CT scan  of the lumbar spine.  Those showed good positioning of the posterior  instrumentation and bone graft without obvious neural compression.  His  activity was significantly diminished for a couple of days and he has  only gradually begun to tolerate increasing his ambulation and sitting  up.  At this point, he is able to sit without discomfort, but he has  some discomfort still with standing and walking.  His wound has healed  nicely.  He has been afebrile and we are having his staples removed  today and he has been given instructions regarding wound care and  activities.   He was started on a tapering Decadron schedule and the improvement in  his discomfort has seemed to concur with the Lyrica and  Decadron and  time.   We are discharging him to home on a tapering Decadron schedule.  We will  continue him on Lyrica.  He has been given a prescription for Decadron 4  mg tablets.  He is to take one tablet q.6h. for four doses, then one  tablet q.12h. for two doses, then a half a tablet q.12h. for two doses,  then a half a tablet daily for two doses and then he is to discontinue.  A total of eight tablets prescribed and no refills.  He was given a  prescription for Lyrica 75 mg b.i.d., 60 tablets, 12 refills.  He was  given a prescription for Miacalcin nasal spray one spray in the nostril  each morning, 3.7 mL bottle.  He is to alternate nostrils.  We  prescribed 12 refills.  He has also been given Percocet 5/325 one or two  q.4-6h. p.r.n. pain, 60 tablets prescribed, no refills, and Flexeril 10  mg q.8h. p.r.n. muscle spasms, 30 tablets, one refill.  He is to return  to my office in three weeks for follow-up or sooner if he has increased  difficulties.  We will have AP and lateral lumbar spine x-rays done on  the day of his appointment.   DISCHARGE DIAGNOSES:  1. Grade 1 to 2 spondylolisthesis of L5 and S1, secondary to bilateral      L5 pars interarticularis defect.  2. Lumbar stenosis, lumbar spondylosis and lumbar radiculopathy.     Hewitt Shorts, M.D.  Electronically Signed    RWN/MEDQ  D:  08/22/2007  T:  08/22/2007  Job:  [id number]

## 2011-05-04 NOTE — H&P (Signed)
[name], Ronald                ACCOUNT NO.:  [id number]   MEDICAL RECORD NO.:  [id number]          PATIENT TYPE:  INP   LOCATION:  5715                         FACILITY:  [facility]   PHYSICIAN:  Zola Button T. Lazarus Salines, M.D. DATE OF BIRTH:  1931-10-22   DATE OF ADMISSION:  10/18/2007  DATE OF DISCHARGE:  10/24/2007                              HISTORY & PHYSICAL   HISTORY AND PHYSICAL/DISCHARGE SUMMARY:   CHIEF COMPLAINTS:  Metastatic melanoma, right neck.   HISTORY OF PRESENT ILLNESS:  This 77 year old white male had a melanoma  on the right temporal scalp in 2004.  He underwent a wide local excision  and a right neck sentinel node biopsy which was negative.  This behaved  well.  Several years later, he had an additional melanoma of his scalp  which apparently was incorrectly identified and resected and then re-  resected, and once again a sentinel node biopsy was negative.  He has  done nicely since that time.  He has a long-standing history of chronic  lymphocytic lymphoma.  Here recently, he developed a right postauricular  mass which was biopsied by Dr. Abbey Chatters and was found to be consistent  with metastatic melanoma.  He is sent here by Dr. Ewing Schlein for  consideration of a therapeutic neck dissection for metastatic melanoma.  Workup thus far has included a negative CT scan and also a negative PET  scan.  He has no symptoms.  His overall health status is good.   PAST MEDICAL HISTORY:  No current active medical conditions.   ALLERGIES:  No know allergies.   MEDICATIONS:  1. He is taking vitamins and mineral supplements.  2. He takes occasional Prilosec for reflux.   PAST SURGICAL HISTORY:  1. He has a history of two hernia repairs.  2. Three separate scalp melanoma surgeries.  3. Sentinel node biopsies x2 two.  4. Back surgery.   SOCIAL HISTORY:  He is married.  He is retired.  He does not smoke or  drink.   FAMILY HISTORY:  Positive for allergies, prostate cancer, colon  cancer,  heart attack, hypertension, diabetes.  Negative for bleeding disorders  or anesthesia reactions.   REVIEW OF SYSTEMS:  Noncontributory.   PHYSICAL EXAMINATION:  GENERAL:  This is a trim, basically healthy-  appearing, older white male.  HEENT:  He has several scalp lesions consistent with excision and skin  graft reconstruction.  He has several post auricular scars on the right  side consistent with prior sentinel node biopsies, one of which was  recent.  Mental status is appropriate.  He hears well in conversational  speech.  Voice is clear and respirations unlabored through the nose.  Ear canals are clear with normal drums.  Anterior nose is clear and not  congested.  Oral cavity is moist with teeth in good repair.  Oropharynx  clear.  I did not examine nasopharynx or hypopharynx.  NECK:  Neck: Without palpable adenopathy.  LUNGS:  Clear to auscultation.  HEART:  Regular rate and rhythm and no murmurs.  ABDOMEN:  Soft and active.  EXTREMITIES:  Normal configuration.  NEUROLOGIC:  Neurologic testing grossly normal.   LABORATORY STUDIES:  Preoperative laboratory values included a white  blood cell count of 10.1, hemoglobin 15.5, hematocrit 45, platelet count  175,000.  Sodium 136, potassium 3.5, glucose 100, BUN 10, creatinine  0.9.   ADMISSION DIAGNOSIS:  Metastatic melanoma, right neck, following  relatively distant scalp melanoma.   PLAN:  The patient is being admitted to the hospital for an extended  neck dissection including carotid bed, post auricular and occipital  lymph node beds and neck dissection proper.   HOSPITAL COURSE:  The patient was admitted on October 18, 2007 and on  that day underwent an extended right neck dissection including a  dissection of the right carotid with preservation of the facial nerve,  right postauricular, right occipital and stations 1 through 5 of the  neck.  He had multiple small lymph nodes consistent with his known   lymphocytic lymphoma.  Facial nerve was preserved, as were spinal  accessory, hypoglossal, lingual and ramus mandibularis his nerves.  Jugular vein and carotid artery were protected.  He had partial  excision/ detachment of the sternocleidomastoid muscle consistent with  upper neck dissection.  The patient tolerated the procedure nicely and  had no surgical or medical related complications.  Estimated blood loss  was 200 cc.   Postoperatively, the patient was transferred to the intermediate care  unit where he was doing nicely with good functioning, drainage, rapid  recovery of full mental and neurologic status and no difficulty with  breathing.  He had minimal pain.  Postoperative laboratory studies  include hematocrit of 35.  Glucose 127, sodium 142, potassium 3.9.  He  had slight venous congestion of the right ear lobule; otherwise the  flaps were intact and viable.  He had significant drainage from the  Hemovac drains but functional neurologic status as above.  The first  postoperative day, he had the Foley catheter discontinued and began an  oral diet and routine gentle activities.  He did have some leakage of  fluid around the entry point of the Hemovac drain which stopped after 1  or 2 days.  He continued to have fairly copious drainage, and at one  point the possibility of a chyle leakage was considered but did not seem  to be sufficient in quantity.  He has some flap edema, but otherwise the  flaps remained good vascular viability.  On the sixth postoperative day  with his drainage having reduced to 3 cc per 24 hours x2 days running,  the drains were discontinued, and he was discharged to his home in the  care of his family.   He was given instructions regarding wound care.  Return visit in 1 week  for removal of stitches and staples and gentle advancement of diet and  activity.  Prescriptions were given for Roxicet and Tylenol with codeine  liquid.  No discharge laboratory  studies were done.  The pathology  report did return prior to his discharge, and he did have lymphocytic  lymphoma in basically every identified lymph node, and he also had one  small focus of presumed metastatic melanoma in 1/16 level V lymph nodes.  This information was relayed to Dr. Jeanette Caprice.   DISCHARGE DIAGNOSES:  1. Metastatic melanoma, right neck.  2. Chronic lymphocytic lymphoma.   PROCEDURE PERFORMED:  Right extended modified radical neck dissection  including parotidectomy with facial nerve preservation, October, 29,  2008.  Complications - none.   CONDITION AT DISCHARGE:  Ambulatory  with drains out and patient assuming  full regular activities and diet.   FOLLOWUP:  Return visit in 1 week.   DISCHARGE MEDICATIONS:  Prescriptions for Roxicet and Tylenol with  codeine for pain control.   DISCHARGE INSTRUCTIONS:  Instructions were written and given regarding  wound care and advancement of diet and activity.      Gloris Manchester. Lazarus Salines, M.D.  Electronically Signed     KTW/MEDQ  D:  11/29/2007  T:  11/29/2007  Job:  [id number]   cc:   Valentino Hue. Magrinat, M.D.  Barry Dienes Eloise Harman, M.D.

## 2011-05-04 NOTE — Op Note (Signed)
[name], [name]                ACCOUNT NO.:  [id number]   MEDICAL RECORD NO.:  [id number]          PATIENT TYPE:  AMB   LOCATION:  SDS                          FACILITY:  [facility]   PHYSICIAN:  Adolph Pollack, M.D.DATE OF BIRTH:  March 14, 1931   DATE OF PROCEDURE:  08/10/2007  DATE OF DISCHARGE:  08/10/2007                               OPERATIVE REPORT   PREOPERATIVE DIAGNOSIS:  Right postauricular mass.   POSTOPERATIVE DIAGNOSIS:  Right postauricular mass.   PROCEDURE:  Excision of right postauricular mass.   SURGEON:  Adolph Pollack, M.D.   ANESTHESIA:  Local (lidocaine) with MAC.   INDICATIONS:  [name] is a 77 year old male who has a history of scalp  melanomas as well as non-Hodgkin lymphoma.  He was noted by Dr. Arminda Resides of [facility] to have a right postauricular  mass which is felt to be a lymph node and was sent over for biopsy of  this and now presents for that.   TECHNIQUE:  He is seen in the holding area and the area marked with my  initials.  He is then brought to the operating room, placed supine on  the operating table, and given some intravenous sedation.  The hair  around the area was clipped and the right ear and postauricular area  were sterilely prepped and draped.  The mass was marked with a marking  pen.  Local anesthetic was infiltrated directly over the mass.  A  curvilinear incision was made in the postauricular area directly over  the mass sharply and sharp dissection was used to divide some  subcutaneous tissue.  The mass was identified and was fairly adherent to  the surrounding tissues and this was carefully dissected free from the  surrounding tissues using blunt dissection and select electrocautery  until it was removed.  It was then sent fresh to pathology.  The wound  was inspected.  No bleeding was noted.   The skin incision was then closed with a running 4-0 Monocryl  subcuticular stitch.  Dermabond was  applied.  He tolerated the procedure  without apparent complications and was taken to the recovery room in  satisfactory condition.      Adolph Pollack, M.D.  Electronically Signed     TJR/MEDQ  D:  08/10/2007  T:  08/10/2007  Job:  [id number]   cc:   Garrison [place]. Yetta Barre, M.D.  Valentino Hue. Magrinat, M.D.  Barry Dienes Eloise Harman, M.D.

## 2011-05-04 NOTE — Op Note (Signed)
[name], [name]                ACCOUNT NO.:  [id number]   MEDICAL RECORD NO.:  [id number]          PATIENT TYPE:  INP   LOCATION:  2899                         FACILITY:  [facility]   PHYSICIAN:  Zola Button T. Lazarus Salines, M.D. 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   POSTOPERATIVE DIAGNOSIS:  In-transit scalp metastases from primary  melanoma.   PROCEDURE PERFORMED:  Multiple local excisions, multiple scalp melanoma  lesions.   SURGEON:  Gloris Manchester. Lazarus Salines, MD   ANESTHESIA:  General LMA.   BLOOD LOSS:  25 mL.   COMPLICATIONS:  None.   FINDINGS:  Eight previously biopsied and identifiable lesions inferior  to the primary resection site on the vertex scalp and going down in a  trend to the preauricular and postauricular area status post prior neck  dissection.  The lesions were mapped on the operative report.  Several  additional unbiopsied lesions in between, identified lesions #2 and 3  suspicious for additional dermal metastases.   PROCEDURE:  With the patient in a comfortable supine position, general  LMA anesthesia was administered.  At an appropriate level, the patient  was placed in a slight sitting position, the head was rotated towards  the left for access to the right scalp and the table was turned 90  degrees for surgical access.  The right scalp in the area of the lesions  was saved to better identify the lesions.  The lesions were identified  and mapped on the operative report.  The lesions were infiltrated with  0.5% Xylocaine with 1:200,000 epinephrine, 8 mL total for intraoperative  hemostasis.  A Hibiclens sterile scrub of the scalp was accomplished.   A routine sterile draping was accomplished.  The nodules were identified  as above.   Beginning with the preauricular nodule, #1, a 3-cm x 1-cm ellipse  was  marked around the lesion with modest margins and the lesion was sharply  excised.  A silk-orienting suture was placed on the superior aspect of  the ellipse, which was then removed entirely and passed off as specimen.  The wound was undermined and closed with interrupted 4-0 Vicryl in the  subcutaneous layer and with staples on the skin.   Lesions 2, 3, 4, and 5 were planned in an intersecting ellipse fashion  for an ultimate T-closure and there were several additional small  nodules in between numbers 2 and 3 which had not been previously  biopsied.  Once again, they were excised with the specimen approximately  5 x 3 cm and sombrero shaped.  All lesions were encompassed.  Orienting stitches were placed.  The lesion was excised and passed off  as specimen.  The wound was widely undermined and then closed in a T-  fashion with interrupted 4-0 Vicryl suture in the subcutaneous layer,  and once again staples on the skin layer.  There was moderate tension on  this wound in the AP direction.   Working on  lesion #8, a 1- x 2.5-cm ellipse was planned around the  lesion which was excised, marked for orientation, undermined, and once  again closed with 4-0 Vicryl and staples.  Finally lesion #6 and 7 were  included in a single ellipse approximately 1.5 x 3.5 cm and were widely  excised.  Orienting marks were placed.  The wound was undermined and  closed with interrupted 4-0 Vicryl and staples on the skin surface.  Adequate closure was accomplished in all sites and hemostasis was  observed.  Cautery had been used for hemostasis where required.  At this  point, the scalp was cleaned and bacitracin ointment applied.  A 4 x 4  and 3-inch Kerlix dressing was applied and secured with tape.  The  procedure was completed.  The patient was returned to Anesthesia,  awakened, extubated, and transferred to recovery in a stable condition.   COMMENT:  A 77 year old white male with a history of a vertex  melanoma  then developed pre and postauricular lymph nodes for which he underwent  an anterior and posterior right neck dissection including parotidectomy  roughly 1 year ago.  Subsequently, he has developed lesions in the scalp  consistent with a dermal in-transit metastases.  It was felt that  minimal excision of multiple lesions followed by radiation therapy might  be his best palliative/? curative option.  Anticipate routine  postoperative recovery with attention to ice, elevation, analgesia, and  wound care.  We will remove the staples in 12 days.  Given low  anticipated risk of postanesthetic or postsurgical complications, I feel  an outpatient venue is appropriate.      Gloris Manchester. Lazarus Salines, M.D.  Electronically Signed     KTW/MEDQ  D:  01/24/2009  T:  01/24/2009  Job:  [id number]   cc:   Valentino Hue. Magrinat, M.D.  Barry Dienes Eloise Harman, M.D.  Daniel B. Yetta Barre, M.D.  Maryln Gottron, M.D.

## 2011-05-04 NOTE — Assessment & Plan Note (Signed)
[facility]   NAME:  [name], [name]                ACCOUNT NO.:  [id number]   MEDICAL RECORD NO.:  [id number]      DATE OF BIRTH:  06-12-1931   PHYSICIAN:  Theresia Majors. Tanda [name], M.D. 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  He has completed his 5th hyperbaric treatment.  There  are no symptoms referable to barotrauma, claustrophobia or oxygen  toxicity.  He was most recently seen by Dr. Lazarus Salines and is scheduled for  additional debridement in 3 days.  There has been no excessive drainage  or malodor.   OBJECTIVE:  Blood pressure is 157/91, pulse rate 96, temperature 98.1,  respirations are 16.  HEENT:  Exam is clear.  NECK:  Supple.  The postsurgical changes are self-evident with full-  thickness necrosis of the triangular area over the lateral right neck.  There are no abnormal pulsations.  There is certainly no aneurysmal  dilatation.  The tympanic membranes are clear.   ASSESSMENT:  Satisfactory clinical response to hyperbaric oxygen.   PLAN:  We will continue the HBO treatment.  Dr. Lazarus Salines will proceed  with the debridement.  We will continue HBO with moist, moist dressings  in preparation for a split-thickness skin graft.  We have explained this  approach to the patient, the wife, and the daughter in terms that they  seem to understand.  They expressed gratitude for having been seen in  the clinic and indicate that they will be compliant as per above.      [name], M.D.  Electronically Signed     HAN/MEDQ  D:  11/09/2007  T:  11/10/2007  Job:  [id number]   cc:   Zola Button T. Lazarus Salines, M.D.

## 2011-05-04 NOTE — Assessment & Plan Note (Signed)
[facility]   NAME:  [name], [name]                ACCOUNT NO.:  [id number]   MEDICAL RECORD NO.:  [id number]      DATE OF BIRTH:  [birth year]   PHYSICIAN:  Jake Shark A. Tanda Rockers, M.D. VISIT DATE:  12/07/2007                                   OFFICE VISIT   SUBJECTIVE:  [name] is a 77 year old man who is undergoing hyperbaric  oxygen treatment for a flap necrosis.  He has completed 23 out of a  total of 30 dives.  There has been no symptoms referable to barotrauma,  oxygen toxicity or claustrophobia.  There has been no fever, he  continues to tolerate his treatments well.   OBJECTIVE:  Blood pressure is 139/79, respirations 16, pulse rate 74,  temperature 97.7.  Examination of the wound shows a continued  contraction of the wound with advancing epithelium, there is no evidence  of infection, there is no excessive drainage.  LUNGS:  Clear.  The trachea is midline.  The tympanic membranes are unremarkable.   ASSESSMENT:  Clinical response to hyperbaric oxygen.  It appears that  the wound will heal primarily with contraction and epithelial migration.   PLAN:  We will continue his HBO.  We will complete is 30 dives.  The  patient will be seen by his otolaryngologist within the next with  several days.  We will discuss future management with Dr. Lazarus Salines.      [name], M.D.  Electronically Signed     HAN/MEDQ  D:  12/07/2007  T:  12/08/2007  Job:  [id number]   cc:   Zola Button T. Lazarus Salines, M.D.

## 2011-05-04 NOTE — Consult Note (Signed)
[name], [name]                ACCOUNT NO.:  [id number]   MEDICAL RECORD NO.:  [id number]          PATIENT TYPE:  REC   LOCATION:  FOOT                         FACILITY:  [facility]   PHYSICIAN:  Theresia Majors. Tanda Rockers, M.D.DATE OF BIRTH:  1931-06-10   DATE OF CONSULTATION:  11/02/2007  DATE OF DISCHARGE:                                 CONSULTATION   SUBJECTIVE:  Mr. Quevedo is a 77 year old man referred by Dr. Flo Shanks for evaluation of a necrosed skin flap following radical neck  surgery.   IMPRESSION:  Flap necrosis.   RECOMMENDATIONS:  Proceed with hyperbaric oxygen treatment and serial  debridements per Dr. Lazarus Salines.  His hyperbarics treatment will be ordered  at 2.4 atmospheres 100% oxygen for 90 minutes with 2 air breaks.   SUBJECTIVE:  Mr. Winship is a 77 year old man with a past history of  prostate cancer, melanoma of the scalp and non-Hodgkin's lymphoma. These  malignancies have occurred over the past several decades but most  recently he was treated for the melanoma with a right radical neck  dissection in October 2008.  The patient did well after surgery but it  was noted that he had areas of nonviability which have progressed.  He  continues to have drainage of serosanguineous nature, but there is no  extensive malodor.  He has been seen on several postop visits by Dr.  Lazarus Salines. He is referred for consideration for hyperbaric oxygen  treatment as an adjuvant to effect closure.   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  He also has reflux esophagitis.  He denies  allergies.   CURRENT MEDICATION:  1. Cephalexin 500 mg 1 capsule 4 times a day.  2. Acetaminophen with codeine that he takes 1-2 tablets 2-3 times a      day.   PAST SURGICAL HISTORY:  Lumbar surgery in August of 2008 and his radical  neck dissection October 18, 2007.  He was treated for non-Hodgkin's  lymphoma several decades ago and his major  chemotherapeutic were  prednisone, Cytoxan, Rituxan, Adriamycin.  He specifically did not have  bleomycin.   FAMILY HISTORY:  Positive for hypertension, cardiovascular disease and  cancer.   SOCIAL HISTORY:  He is married.  He has adult children who live in the  local area.  He is retired.   REVIEW OF SYSTEMS:  The patient is active.  He specifically denies chest  pain.  There has been no orthopnea.  He does not have a chronic cough.  He has been recently evaluated by doctors at the [facility] cardiology  clinic and has had a nonacute chest x-ray. He has a history of having  had several coronary stents.  He does admit to some dysphasia attributed  to reflux. His weight has been stable. There are no stigmata of  transient ischemic attacks.  The remainder of the review of systems is  negative.   PHYSICAL EXAM:  He is an alert, oriented man in no acute distress.  He  is accompanied by his wife and his daughter.  The blood pressure is 133/83, respirations 16, pulse rate is 80,  temperature is 98.5.  HEENT:  Remarkable for postsurgical changes.  Both tympanic membranes  were visualized although there is moderate amount of wax in the ear  canal. The ear drum did not appear to be injected nor  was fluid  visible. The incision on the right lateral neck is consistent with a  radical neck dissection. There is a deltoid area of flap necrosis  extending from posterior to anterior with a central apex.  There is  evidence of a seroma which is partially drained and has actually  liquefied in the dehisced area of the wound.  There appears to be full  thickness necrosis in this area but there is what appears to be viable  muscle beneath.  There is no malodor.  There is no fluctuance. The  carotid is readily palpable with 3+.  There is no evidence tactically of  aneurysmal dilatation or periadventitial collection. There is a  satellite lesion most likely representing an area of the suction drain   exit.  There is no evidence of active infection but the wound is  moderately hyperemic. The left neck is normal.  There is no adenopathy.  The lungs are clear.  The heart sounds are distant.  The abdomen is  soft.  The extremities are warm.  Neurologically the patient is grossly  intact.   DISCUSSION:  Mr. Stogsdill is a 77 year old man who has an area of flap  necrosis which is currently beyond salvage; however, there are areas of  the flap which remain dusky. We are recommending that we proceed with  hyperbaric oxygen treatment to decrease tissue loss and to promote  contraction and subsequent coverage of this wound.   The dive table #5 will be used.  We would anticipate the patient will  need between 20-30 HBO treatments for maximum benefit.   We have reviewed a recent electrocardiogram which is nonacute dated  August 4. The CAT scan of the abdomen shows no significant bullous  disease.  A CBC and a BMET have been reviewed, none of which show  contraindications to proceeding with HBO.   We have explained the treatment of the flap dehiscence utilizing the  hyperbaric oxygen as an adjuvant to the patient, his daughter and his  wife in terms that they seem to understand.  We specifically reviewed  the potential complications of barotrauma, claustrophobia and oxygen  toxicity.  We have given the three of them an opportunity to ask  questions.  They have toured the hyperbaric facility.  They express  gratitude for having been seen in the clinic  and are anxious to begin  the treatment.  We will begin the hyperbaric treatment per protocol on  November 03, 2007.      [name], M.D.  Electronically Signed     HAN/MEDQ  D:  11/02/2007  T:  11/03/2007  Job:  [id number]   cc:   Zola Button T. Lazarus Salines, M.D.  Valentino Hue. Magrinat, M.D.  Thomas C. Daleen Squibb, MD, [facility]  Barry Dienes. Eloise Harman, M.D.

## 2011-05-04 NOTE — Assessment & Plan Note (Signed)
[facility]   NAME:  [name], [name]                ACCOUNT NO.:  [id number]   MEDICAL RECORD NO.:  [id number]      DATE OF BIRTH:  [birth year]   PHYSICIAN:  [name], M.D. VISIT DATE:  11/23/2007                                   OFFICE VISIT   SUBJECTIVE:  [name] is a 77 year old man who we are following for  flap necrosis associated with a right radical neck dissection.  He is  being treated with hyperbaric oxygen.  He reports that there has been  less drainage, no malodor. His mobility is essentially unchanged.  He is  complaining of some muffled hearing.  There are no symptoms of  claustrophobia or oxygen toxicity.   OBJECTIVE:  VITAL SIGNS:  Blood pressure is 128/79, respirations 16,  pulse rate 90, temperature is 97.6.  He is accompanied by his wife.  NECK:  Inspection of the wound shows that there is continued  contraction.  There are minuscule areas of necrosis in the inferior  aspect and in antral superior portion of the wound.  There is no  drainage.  A debridement is deferred to Dr. Lazarus [name].  HEENT:  Remarkable for postsurgical changes.  There are no acute  injuries to the tympanic membranes.  LUNGS: Clear.  HEART:  The heart sounds were normal.   ASSESSMENT:  Satisfactory response to hyperbaric oxygen.   PLAN:  We will continue HBO and reevaluate the patient in 1 week      [name], M.D.  Electronically Signed     HAN/MEDQ  D:  11/23/2007  T:  11/23/2007  Job:  [id number]   cc:   [name]. [name], M.D.

## 2011-05-04 NOTE — Procedures (Signed)
DUPLEX DEEP VENOUS EXAM - LOWER EXTREMITY   INDICATION:  Left lower extremity pain and swelling.   HISTORY:  Edema:  Left lower extremity since yesterday.  Trauma/Surgery:  Pain:  Left lower extremity 3-4 weeks.  PE:  No.  Previous DVT:  Five to 6 years ago in the right lower extremity in  [place] per the patient.  Anticoagulants:  No.  Other:   DUPLEX EXAM:                CFV   SFV   PopV   PTV   GSV                R  L  R  L  R  L   R  L  R  L  Thrombosis    0  +     0     +      +     +  Spontaneous   +  D     +     D      0     D  Phasic        +  D     +     D      0     D  Augmentation  +  D     +     D      0     D  Compressible  +  P     +     P      0     P  Competent     +  D     +     D      0     D   Legend:  + - yes  o - no  p - partial  D - decreased   IMPRESSION:  1. Evidence of DVT of mixed age in the left common femoral vein,      distal popliteal, profunda, posterior tibial and peroneal veins.  2. Evidence of SVT in greater saphenous vein.  3. No evidence of DVT in right common femoral vein, left superficial      femoral vein or left proximal popliteal vein.  4. Dr. Eloise Harman was notified.    _____________________________  Larina Earthly, M.D.   AS/MEDQ  D:  08/21/2008  T:  08/21/2008  Job:  [id number]

## 2011-05-04 NOTE — H&P (Signed)
NAMEROBY, DONAWAY                ACCOUNT NO.:  [id number]   MEDICAL RECORD NO.:  [id number]          PATIENT TYPE:  INP   LOCATION:  5152                         FACILITY:  [facility]   PHYSICIAN:  Hewitt Shorts, M.D.DATE OF BIRTH:  [birth year]   DATE OF ADMISSION:  08/17/2007  DATE OF DISCHARGE:                              HISTORY & PHYSICAL   HISTORY OF PRESENT ILLNESS:  The patient is a 77 year old left-handed-  white male, who has presented with left lumbar radicular pain radiating  from the low back down to the left buttock, thigh and leg.  Aggravated  by walking, not as much by standing.  He denies right lower extremity  radicular pain.  He does describe some low back pain, particular with  extension of his back.  The patient was first evaluated about 16 months  ago and treated symptomatically.  He returned last month because of  worsening pain, and requested reevaluation.  X-rays show a grade I to II  spondylolisthesis of L5 and S1 secondary to L5 paracentral articularis  defects with marked disc space narrowing.  MRI scan reconfirms these  findings, and severe neuroforaminal stenosis bilaterally at L5-S1.  Decision made to proceed with an L5 Gill procedure, bilateral L5-S1  posterior lumbar interbody fusion and bilateral L5-S1 posterolateral  arthrodesis with interbody implants plus  instrumentation and bone  grafts.   PAST MEDICAL HISTORY:  Notable for a history of a number of  malignancies.  He has had 2 melanomas removed, 1 squamous cell cancer  removed, numerous basal cell cancers removed.  He was diagnosed with  chronic lymphocytic leukemia in 2000.  He developed non-Hodgkin's  lymphoma in 2005, and chemotherapy was initiated.  He has a history of  prostate cancer diagnosed in 1998 and treated with radiation seeds,  followed by Dr. Elfredia Nevins.  He does not describe any history of  hypertension, myocardial infarction, stroke, diabetes, peptic ulcer  disease or lung  disease.   PAST SURGICAL HISTORY:  Include herniorrhaphies by Dr. Abbey Chatters in  2003 and 2005, and resection of melanoma skin cancer in 2003 and 2007 by  Dr. Abbey Chatters.   ALLERGIES:  He denies allergies to medications.   CURRENT MEDICATIONS:  Include:  1. Prilosec over the counter.  2. Aspirin 81 mg daily.  3. A number of supplements, as well as some eye drops for glaucoma.   FAMILY HISTORY:  His parents have passed on.  His mother had diabetes  and heart disease.  His father had heart disease.   SOCIAL HISTORY:  The patient is married, he is retired.  He does not  smoke, drink alcoholic beverages or have a history of substance abuse.   REVIEW OF SYSTEMS:  Notable for what was described in his history of  present illness and past medical history, but is otherwise unremarkable.   PHYSICAL EXAMINATION:  GENERAL:  The patient is a well-developed, well-  nourished, white male in no acute distress.  VITAL SIGNS:  Temperature is 97.1, pulse 67, blood pressure 145/83,  respiratory rate 18, height 5 foot 6 inches, weight 66 kilograms.  LUNGS:  Clear to auscultation.  He has symmetrical respiratory  excursion.  HEART:  Regular rate and rhythm.  S1, S2.  There is no murmur.  EXTREMITIES:  Shows no clubbing, cyanosis or edema.  MUSCULOSKELETAL:  Shows excellent mobility in the low back and able to  flex to 10 degrees.  No pain with flexion and extension.  There is no  tenderness to palpation over the lumbar spinous process or probable  musculature.  Straight leg raising is negative bilaterally.  NEUROLOGIC:  Shows 5/5 strength in the lower extremities, including the  iliopsoas, flexors and plantar flexion bilaterally.  Sensation is intact  to pin prick in the upper and lower extremities.  Reflexes are absent in  the biceps, brachioradialis, triceps, minimal in the quadriceps, absent  in the gastrocnemius.  He has normal gait and stance.   IMPRESSION:  Patient with increasing disabling  left lumbar radiculopathy  secondary to neuroforamenal stenosis, secondary to spondylolisthesis,  secondary to paracentral articularis defect.  There is a grade I to II  listhesis of L5-S1.  There is severe bilateral neuroforamenal stenosis  at L5-S1 with intact neurologic function.   PLAN:  The patient will be admitted for an L5 Gill procedure, bilateral  L5-S1 posterior lumbar interbody fusion and bilateral L5-S1  posterolateral arthrodesis with interbody implants plus  instrumentation  and bone graft.  We discussed the nature of the surgery, alternatives of  the surgery, typical length of surgery, hospitalization, overall  limitations postoperatively, recommendation for a post-op immobilization  in lumbar corset, the plan to use a  Cell Saver during surgery, and we  discussed risks of surgery including risks of infection, bleeding,  possibly need a transfusion, the risks nerve dysfunction, pain,  numbness, weakness, numbness or paresthesias, the risk of dural tear and  CSF leakage, possible need for further surgery, risk of failed surgery,  and anesthetic risk of myocardial infarction, stroke, and death.  Understanding all this, he wishes to go ahead with surgery and is  admitted for such.      Hewitt Shorts, M.D.  Electronically Signed     RWN/MEDQ  D:  08/17/2007  T:  08/17/2007  Job:  [id number]

## 2011-05-04 NOTE — Op Note (Signed)
[name], [name]                ACCOUNT NO.:  [id number]   MEDICAL RECORD NO.:  [id number]          PATIENT TYPE:  INP   LOCATION:  5152                         FACILITY:  [facility]   PHYSICIAN:  Hewitt Shorts, M.D.DATE OF BIRTH:  [birth year]   DATE OF PROCEDURE:  08/17/2007  DATE OF DISCHARGE:                               OPERATIVE REPORT   PREOPERATIVE DIAGNOSES:  1. This is a grade 1-2 spondylolisthesis bilateral L5 pars      interarticularis defect.  2. Bilateral L5-S1 severe neural foraminal stenosis.  3. Lumbar spondylosis.   POSTOPERATIVE DIAGNOSES:  1. This is a grade 1-2 spondylolisthesis bilateral L5 pars      interarticularis defect.  2. Bilateral L5-S1 severe neural foraminal stenosis.  3. Lumbar spondylosis.   PROCEDURE:  L5 Gill procedure and bilateral L5-S1 posterolateral  arthrodesis with radius post instrumentation, VTOS with bone marrow  aspirate and locally harvested morselized autograft with  microdissection.   SURGEON:  Hewitt Shorts, MD   ASSISTANT:  Danae Orleans. Venetia Maxon, MD   ANESTHESIA:  General endotracheal.   INDICATIONS:  The patient is a 77 year old man who presented with  disabling left lumbar radiculopathy.  He had a grade 1-2  spondylolisthesis L5 and S1 secondary to bilateral L5 pars  interarticularis defect.  There was significant pseudodisc herniation  with significant compression within the neural foramina of the exiting  L5 nerves roots.  The decision made to proceed with decompression  arthrodesis.   PROCEDURE:  The patient was brought to the operating room and placed  under general endotracheal anesthesia.  The patient was turned to a  prone position.  Lumbar region was prepped with Betadine scrub and  solution, draped in a sterile fashion in the midline, infiltrated with  local anesthetic with epinephrine.  The level of the listhesis was  identified and a midline incision was made over the listhesis.  The  midline was  infiltrated with local anesthetic with epinephrine prior to  skin incision.  The incision was carried down through the subcutaneous  tissue to the lumbar fascia, which was incised bilaterally, and the  paraspinal muscle was dissected off the spinous process and lamina in a  subperiosteal fashion.  A localized x-ray was taken and the L4, L5, and  S1 spinous process and lamina were identified.  We exposed the lateral  aspects of the transverse process at L5 and S1 bilaterally and the  mobile posterior element of L5 was identified.  We dissected the  ligamentum flavum from this and opened up the facet capsule bilaterally.  We were able to mobilize the posterior element as a single bony  structure.  Then with magnification and microdissection surgical  technique, we explored the neural foramina bilaterally.  There was  significant pseudoarthrosis and this was carefully removed using  Kerrison punches.  Bipolar cautery was used to maintain hemostasis.  We  followed the L5 nerve roots out laterally and ensured good decompression  bilaterally.  We then examined the ventral epidural space.  The actual  disc space at L5-S1 was obscured by the posterosuperior aspect of S1.  This area of the disc space was overlaid by the exiting L5 nerve roots,  which we felt had been adequately decompressed and therefore we elected  not to proceed with discectomy and interbody fusion, but rather solely  decompression and posterolateral arthrodesis.  The C-arm fluoroscope was  draped and brought into the field.  We identified the pedicle entry  sites for the L5 and S1 screws bilaterally.  Each of the pedicles was  probed and examined with ball probe.  No cutouts were found.  Each of  them were tapped with a 5.25-mm tap.  Again, I examined with a ball  probe, good threading was noted, and cutouts were found.  Then we placed  5.75-mm screws, placing 45-mm screws at L5 and 30-mm screws at S1.  We  then selected rods,  placing a 30-mm rod bilaterally using a prelordosed  rod on the right and a straight rod on the left.  However, as we  examined the screw at left-sided L5, some of the remaining  pseudoarthrosis had broken away and was compressing the superior aspect  of the exiting left L5 nerve root and it was felt that we were going to  need to reposition the screw at L5; we therefore removed that screw,  removed the pseudoarthrotic material, and the nerve was once again  decompressed.  We brought the C-arm fluoroscope back in and reprobed the  left L5 pedicle, positioning the new screw hole both more rostrally and  more laterally.  It was again examined with a ball probe and no cutouts  were found.  It was retapped with a 5.25-mm tap and we once again placed  the 5.75 x 45-mm screw.  Repeat imaging showed the screw in good  position and that the L5 root was well decompressed.  We then placed the  straight rod.  Locking caps were placed bilaterally and tightened  against the countertorque.  We then decorticated the transverse process  of L5 and the S1 bilaterally and packed a combination of VTOS bone  aspirate and locally harvested morselized autograft in the lateral  gutters over the transverse process and in the intertransverse space.  We then proceeded with closure once the wound had been irrigated  extensively with bacitracin solution.  The defect was closed with  interrupted undyed 1 Vicryl sutures.  The Scarpa fascia was closed with  interrupted undyed 1 Vicryl sutures.  The subcutaneous and subcuticular  were closed with interrupted 2-0 Monocryl sutures.  The skin edges were  closed with surgical staples.  The wound was dressed with dapsone and  sterile gauze.  The procedure was tolerated well.  The estimated blood  loss was 200 mL.  We did use a cell saver during the case, but the cell  saver technician did not feel that there was a sufficient blood loss to  warrant spitting down the collected  blood.  Sponge and needle count were  correct.  Following surgery, the patient was turned back to the supine  position to be reversed of anesthetic, extubated, and transferred to the  recovery room for further care.      Hewitt Shorts, M.D.  Electronically Signed     RWN/MEDQ  D:  08/17/2007  T:  08/17/2007  Job:  [id number]

## 2011-05-04 NOTE — Assessment & Plan Note (Signed)
[facility]                            CARDIOLOGY OFFICE NOTE   NAME:[name], [name]                       MRN:          [id number]  DATE:07/30/2008                            DOB:          22-Mar-1931    Ms. Lubbers comes in today for further management of his coronary artery  disease.   He is having no symptoms of angina or ischemia.  He continues to take  only an aspirin.   He has had major resection of his right neck from melanoma.  He  continues to have a very optimistic outlook.   He is still enjoying riding his motorcycle on the [place] in [place]  and [place].   He denies any orthopnea, PND, tachy palpitations, syncope, presyncope,  peripheral edema.   PRESENT MEDICATIONS:  Cardiovascular wise is an aspirin a day.   PHYSICAL EXAMINATION:  His blood pressure is 136/86, pulse is 96 and  regular.  His EKG is unchanged with sinus rhythm.  HEENT:  Normocephalic, atraumatic.  PERRLA.  Extraocular movement is  intact.  Sclerae are clear.  Face symmetry is normal.  NECK:  Carotid upstrokes are equal bilaterally without bruits.  He has  extensive scarring from resection of the right neck.  LUNGS:  Clear.  HEART:  Regular rate and rhythm.  No S4.  No murmur.  ABDOMEN:  Soft, good bowel sounds.  No midline bruit.  No hepatomegaly.  EXTREMITIES:  No cyanosis, clubbing or edema.  Pulses are intact.  NEURO:  Intact.   A stress Myoview last year for surgical clearance showed an EF of 46%  with small fixed defect at the base of the inferior wall.  There was no  significant ischemia.   ASSESSMENT AND PLAN:  Ms. Humphrey continues to do well.  We have made no  changes obviously in his medical program which is minuscule.  We will  plan on seeing him back in a year.     Thomas C. Daleen Squibb, MD, [facility]  Electronically Signed    TCW/MedQ  DD: 07/30/2008  DT: 07/31/2008  Job #: [id number]

## 2011-05-04 NOTE — Assessment & Plan Note (Signed)
[facility]                            CARDIOLOGY OFFICE NOTE   NAME:[name] [name] THEILER                       MRN:          [id number]  DATE:07/24/2007                            DOB:          [birth year]    [name] [name] returns today for further management of following issues:  1. Coronary artery disease. He has had a previous inferior wall      infarct, 2 stents to the right coronary artery and post descending      vessel, as well as 2 stents in the circumflex. His last stress      Myoview was February 18, 2005. Exercise time 9 minutes 10.1 [place],      ejection fraction 48%, no ischemia, previous inferior wall infarct.  2. He is having no angina or ischemic symptoms.   He needs preoperative clearance with [name] [name] for a lumbar disc  procedure. He also is getting ready to start maintenance chemotherapy  for slight progression of his lymphoma based on recent staging CT in  July/2008. He is going to received Rituxan.   His cardiovascular meds are only aspirin a day. HE IS STATIN INTOLERANT.   As usual he is very jovial and has great outlook. His blood pressure is  140/88, his pulse is 77 and regular, his weight is 145.  HEENT: Normocephalic, atraumatic. PERRLA extraocular movements intact.  Sclera clear. Facial symmetry is normal. Carotids are full.  LUNGS: Clear.  HEART: Reveals a nondisplaced PMI. Normal S1, S2, without gallop.  ABDOMINAL EXAM: Soft, good bowel sounds. No midline bruits.  EXTREMITIES: Reveal no edema. Pulses are intact.  NEURO EXAM: Intact.  SKIN: Shows a few ecchymoses.   Electrocardiogram shows normal sinus rhythm, __________ criteria for  LVH.   ASSESSMENT/PLAN:  [name] [name] is doing well from a cardiovascular stand  point. He __________ assessment of his coronaries with  a rest/stress  Myoview. We will also clear him for surgery if this is negative for  ischemic.   Otherwise I will see him back in a year.     Ronald C. Daleen Squibb,  MD, [facility]  Electronically Signed    TCW/MedQ  DD: 07/24/2007  DT: 07/24/2007  Job #: [id number]

## 2011-05-04 NOTE — Assessment & Plan Note (Signed)
[facility]   NAME:  BARTH, TRELLA                ACCOUNT NO.:  [id number]   MEDICAL RECORD NO.:  [id number]      DATE OF BIRTH:  July 15, 1931   PHYSICIAN:  Jake Shark A. Tanda Rockers, M.D. VISIT DATE:  12/20/2007                                   OFFICE VISIT   SUBJECTIVE:  Mr. Farrington is a 77 year old man who has undergone 30 HBO  treatments for a failed flap attendant with a radical neck dissection on  the right.  He has had some recent fullness in the left ear.  There is  been no fever.  He continues to be ambulatory.   OBJECTIVE:  Blood pressure is 149/73, respirations 16, pulse rate 89,  temperature 97.6.  Inspection of the right neck shows that the wound has an area of  continued healthy advancement of epithelium.  There is a central area of  a colloidal dressing which is well adherent.  There is no evidence of  infection.  There is no significant reduction in range of motion.   ASSESSMENT:  Satisfactory response to hyperbaric oxygen treatment.   PLAN:  We will discharge the patient from the [facility] and  return him to the care of Dr. Lazarus Salines.  We have counseled the patient  regarding the continuation of his antiseptic soap washes and dry  dressing in the interim.  We have given him an opportunity to ask  questions.  He expresses gratitude for having been seen in the clinic.  He is discharged.      [name], M.D.  Electronically Signed     HAN/MEDQ  D:  12/20/2007  T:  12/20/2007  Job:  [id number]   cc:   Gloris Manchester. Lazarus Salines, M.D.

## 2011-05-04 NOTE — Op Note (Signed)
[name], [name]                ACCOUNT NO.:  [id number]   MEDICAL RECORD NO.:  [id number]          PATIENT TYPE:  INP   LOCATION:  2310                         FACILITY:  [facility]   PHYSICIAN:  Zola Button T. Lazarus Salines, M.D. 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   PROCEDURE PERFORMED:  Right superficial parotidectomy with facial nerve  preservation.  A right extended conservation neck dissection.   SURGEON:  Gloris Manchester. Lazarus Salines, M.D.   ANESTHESIA:  General orotracheal.   ASSISTANT:  Jefry H. Pollyann Kennedy, MD.   BLOOD LOSS:  200 mL.   COMPLICATIONS:  None.   FINDINGS:  A long relatively thin muscular neck.  Small lymph nodes  identified in the preauricular area, and the postauricular and  suboccipital area, and lymph node stations 1, 2, 3, 4 and 5 throughout  the neck.  Several these were darkish in color but may have been  anthracotic as opposed to melanotic.  No evidence of lymph nodes which  were fibrotic or fixed to other structures.  Spinal accessory nerve,  hypoglossal, lingual nerves protected.  Sternocleidomastoid muscle was  partially resected, jugular vein and carotid artery protected.   PROCEDURE:  With the patient in a comfortable supine position, general  orotracheal anesthesia was induced without difficulty.  At an  appropriate level, the patient was repositioned in a partial left  lateral decubitus position and supported with a beanbag.  He was  carefully padded.  A Foley catheter was placed.  The patient had TED  hose and PAS hose both to prevent DVT.  The head was rotated to the left  for access to the entire right neck including the posterior neck.  A  minimal hair shave at the occiput was  performed on the right side only.   The neck was palpated with no significant findings.  Previous scars from  the recent lymph node resection and from a previous sentinel node a  biopsy were noted and were chosen to be included in the resection  specimen.  A modified Shobinger incision was planned with a limb curved  and transversely across the occiput and the limb in front of the carotid  in anticipation of a routine parotidectomy.  Various crosshatches were  placed to accurately realign the flaps at the conclusion of the  procedure.   A sterile preparation and draping of the entire right face and neck  including the use of the nerve integrity monitor system was performed.  A clear drape was applied across the face for observation of facial  motion.   The preauricular incision was sharply executed and carried down below  the lobule.  The neck incision was used to circumcise the previous  linear scar and was continued anteriorly in anticipation of neck  dissection.  The scar had gone into the postauricular region and part of  this incision was executed and the remainder was left marked for  possible inclusion.   Working in a broad front, the dissection was carried down in the  preauricular plane along the cartilage of the ear canal, and in the neck  down to the sternocleidomastoid muscle and to the mastoid tip.  The  parotid gland was rolled forward off the mastoid muscle and along the  ear canal.  The digastric muscle was identified and again the specimen  was dissected on a broad front using the Shaw scalpel.  Finally, with  blunt dissection, the facial nerve was identified.  It was carefully  dissected and the tissue lateral to the nerve was lysed with a Shaw  scalpel.  The dissection was carried up along the ear canal and in the  preauricular area up onto the temporalis fascia.  Dissection was carried  down the temporalis fascia.  Branches of the superficial temporal artery  and  vein were identified in controlled with ligature and dissected  downward.  This tissue was sent separately for pathologic  interpretation.   From the facial nerve, the pes was identified and working along the  several branches starting with the superior most branch, the dissection  was carried to the periphery with lysis of the gland and the gland was  rolled downward.  This was performed all the way down to the cervical  branch all of which were remaining intact.  Hemostasis was used with the  Shaw scalpel and where required with silk ligatures or bipolar cautery.  The nerve was intact at completion of the dissection.   After completing the parotidectomy, subplatysmal flaps were raised  superiorly and inferiorly.  The neck was quite long and an inferior  posterior incision was felt necessary to reach the posterior inferior  corner of the station 5.  The incision was extended superiorly and then  transversely across the occiput to the midline and flaps were elevated  taking care to preserve the hair follicles.  Working posteriorly from  the postauricular sulcus, the soft tissue was dissected down to the  level of the mastoid cortex and this was carried down onto the occipital  musculature and down onto the superior aspect of the sternocleidomastoid  muscle.  Soft tissue was dissected away from the trapezius and splenius  capitis muscles.  Working with the splenius capitis and dissected and  retracted forward, the thin fascial plane was dissected beneath the  splenius capitis and carried forward underneath the splenius capitis to  communicate with the neck specimen.  There were at least one or two  small gray colored lymph nodes in this area.  This tissue was harvested  separately as both superficial and deep suboccipital nodal tissue and  sent to pathology separately.   The dissection was carried down the trapezius muscle to the posterior  corner of the posterior triangle.  It was  carried old across the  clavicle.  The spinal accessory nerve was identified, dissected and  preserved as it rose up towards the sternocleidomastoid muscle.  Working  medial to the free edge of the trapezius, the dissection was carried  down to the floor of the posterior triangle and with sharp and blunt  dissection, this was rolled forward.  Branches of the transverse  cervical artery and vein were controlled with silk ligature.  Dividing  the superficial layer and the deep layer of the cervical fascia and the  supraclavicular triangle, this was bluntly elevated and the cervical  nerve  roots were divided 1 or 2 cm away from the neck.  The phrenic  nerve was identified through the fascial layer and was not disturbed.  The specimen was rolled forward.  It was split to allow it to come  around the spinal accessory nerve as it entered the sternocleidomastoid  muscle.   The fascia on the lateral surface of the muscle was elevated and rolled  around the anterior surface of the muscle onto the medial surface.  The  spinal accessory nerve was again identified as it entered the  sternocleidomastoid muscle was dissected up to where it lay lateral to  the high jugular vein.  The specimen was delivered underneath the  sternocleidomastoid muscle after dissecting off the medial surface and  the lateral surface of the levator scapulae superiorly and was brought  into the neck in this fashion.   Working along the ramus of the mandible, the ramus mandibularis branch  was identified and protected.  It was dissected upward.  Small facial  nodes were identified and kept with the specimen and branches of the  facial artery and vein were controlled along the body of the mandible.  The dissection was carried down into the submandibular triangle bluntly  and sharply.  Branches of facial artery and vein were controlled  superior and inferior to the gland.  Working beneath the mandible, the  mylohyoid muscle  was identified and dissected.  The duct was identified  and controlled with silk ligatures.  The submandibular ganglion was  identified and controlled with silk ligatures.  The lingual nerve was  not disturbed.  The gland was rolled downward and deep to this, the  hypoglossal nerve was identified and not disturbed.  The dissection was  carried along the anterior and posterior bellies of the digastric and  the station one specimen remained attached to the neck.   Working down the strap muscle.  The fascia was taken laterally and  brought back over the carotid.  The fascia overlying the carotid artery  was dissected sharply and bluntly taking care to sacrifice very few of  the branches especially in the region of the superior thyroid artery and  superior laryngeal nerve.   The dissection at this point was carried along the muscles of the  posterior triangle up until the jugular vein was encountered and the  jugular vein was unwrapped sharply.  Branches were controlled carefully  with silk ligature.  The vagus nerve was identified and dissected and  not disturbed.  At this point the specimen remained pedicled by the  inferior jugular attachments and was carefully dissected and delivered.  It was marked orienting sutures for pathology.  The wound was thoroughly  irrigated and observed.  Several small areas were controlled with  electrocautery.  A Valsalva revealed good hemostasis.  At this point the  wound was irrigated once again.  10 mm wide perforated flat drains were  applied into the posterior triangle, and up into the anterior neck and  submandibular triangle.  These were secured to the skin with 3-0 Ethilon  stitches.   The flaps were laid back down and were completely closed with buried 4-0  and 3-0 chromic sutures.  I attempted to reapproximate the superior  aspect of the sternocleidomastoid muscle to the mastoid tip using  interrupted 4-0 Vicryl sutures.  Hemostasis was observed.   An airtight  seal was observed and the drains were placed to suction.  The  preauricular portion of the wound was closed  with a running simple 4-0  Ethilon.  Note that previous cross-hatching sutures had been placed for  orientation and were used to sure adequate closure in a normal anatomic  position.  At this point the procedure was completed.  The patient was  returned to Anesthesia, awakened, extubated, and transferred to recovery  in stable condition.   Note that at the initiation of the procedure, the nerve integrity  monitor system was applied and was used throughout the parotidectomy but  then no longer was necessary.   COMMENT:  77 year old white male with a history of two separate scalp  melanoma episodes each of which included a negative sentinel node  biopsy, only to have him come up with a right postauricular node  positive for melanoma earlier this summer.  This was the indication for  today's procedure.  Anticipate routine postoperative recovery with  attention to ice, elevation, analgesia, suction drainage.      Gloris Manchester. Lazarus Salines, M.D.  Electronically Signed     KTW/MEDQ  D:  10/18/2007  T:  10/19/2007  Job:  [id number]   cc:   Valentino Hue. Magrinat, M.D.  Thomas C. Daleen Squibb, MD, [facility]  Barry Dienes. Eloise Harman, M.D.

## 2011-05-04 NOTE — Procedures (Signed)
DUPLEX DEEP VENOUS EXAM - LOWER EXTREMITY   INDICATION:  Left leg pain.  History of left lower extremity DVT.   HISTORY:  Edema:  Yes.  Trauma/Surgery:  No.  Pain:  Yes.  PE:  No.  Previous DVT:  Yes.  Anticoagulants:  None.  Other:   DUPLEX EXAM:                CFV   SFV   PopV  PTV    GSV                R  L  R  L  R  L  R   L  R  L  Thrombosis    o  +     +     +      +     o  Spontaneous   +  0     0     0      0     0  Phasic        +  0     0     0      0     0  Augmentation  +  0     0     0      0     0  Compressible  +  0     0     0      0     0  Competent     +  0     0     0      0     0   Legend:  + - yes  o - no  p - partial  D - decreased   IMPRESSION:  Chronic deep venous thrombosis noted in the left common  femoral vein, deep femoral vein, superficial femoral vein, popliteal and  tibial veins.   Notified Diane with results.    _____________________________  P. Liliane Bade, M.D.   MG/MEDQ  D:  02/04/2009  T:  02/04/2009  Job:  [id number]

## 2011-05-04 NOTE — Assessment & Plan Note (Signed)
[facility]   NAME:  [name], [name]                ACCOUNT NO.:  [id number]   MEDICAL RECORD NO.:  [id number]      DATE OF BIRTH:  Aug 14, 1931   PHYSICIAN:  Jake Shark A. Tanda Rockers, M.D. VISIT DATE:  12/13/2007                                   OFFICE VISIT   SUBJECTIVE:  [name] is a 77 year old man whom we are treating for a  necrosed skin flap.  He has undergone 27 HBO treatments of a total  ordered 30 treatments.  There has been no interim fever, excessive  drainage.  No symptoms referable to barotrauma, oxygen toxicity, or  claustrophobia.   OBJECTIVE:  Blood pressure is 149/68, respirations 18, pulse rate 79,  temperature 98.1.  HEENT exam is clear save the postsurgical changes.  The wound itself  shows that impressive contraction.  Please refer to the photographs.  There is no drainage, no evidence of infection.   ASSESSMENT:  Clinical improvement with HBO.   PLAN:  We will continue to complete the course of 30 dives.      [name], M.D.  Electronically Signed     HAN/MEDQ  D:  12/13/2007  T:  12/13/2007  Job:  [id number]

## 2011-05-04 NOTE — Assessment & Plan Note (Signed)
[facility]   NAME:  ERROL, ALA                ACCOUNT NO.:  [id number]   MEDICAL RECORD NO.:  [id number]      DATE OF BIRTH:  06-05-1931   PHYSICIAN:  Jake Shark A. Tanda Rockers, M.D. VISIT DATE:  11/30/2007                                   OFFICE VISIT   SUBJECTIVE:  Mr. Geisel is a 77 year old man whom we are seeing in  followup for a right radical neck dissection with skin flap.  He has  undergone hyperbaric oxygen treatment having completed 18 of 30 ordered  dives today.  In the interim, the wound was debrided by Dr. Lazarus Salines.  There has been no excessive drainage or malodor.   OBJECTIVE:  Blood pressure is 157/93 with pulse rate of 90, respirations  18, temperature 98.1.  Inspection of the right lateral neck shows that there is completely  granulated wound that is markedly contracted with advancing epithelium.   ASSESSMENT:  Clinical response to hyperbaric oxygen therapy.   PLAN:  Will continue his HBO treatments.  The patient is encouraged to  keep his appointments with Dr. Lazarus Salines to determine the need for split  thickness skin graft coverage.      [name], M.D.  Electronically Signed     HAN/MEDQ  D:  11/30/2007  T:  12/01/2007  Job:  [id number]

## 2011-05-07 NOTE — Op Note (Signed)
[name], [name]                          ACCOUNT NO.:  [id number]   MEDICAL RECORD NO.:  [id number]                   PATIENT TYPE:  OIB   LOCATION:  6708                                 FACILITY:  [facility]   PHYSICIAN:  Etter Sjogren, M.D.                  DATE OF BIRTH:  1931/04/16   DATE OF PROCEDURE:  12/06/2003  DATE OF DISCHARGE:  12/07/2003                                 OPERATIVE REPORT   PREOPERATIVE DIAGNOSES:  Melanoma of the right forehead and temple area.   POSTOPERATIVE DIAGNOSES:  Melanoma of the right forehead and temple area.   PROCEDURE:  1. Radical excision of melanoma of the forehead.  2. A full-thickness skin graft with donor site, left neck.   SURGEON:  Etter Sjogren, M.D.   ANESTHESIA:  General.   ESTIMATED BLOOD LOSS:  Minimal.   INDICATIONS FOR PROCEDURE:  The patient is a 77 year old male with an  intermediate level of melanoma of his forehead.  He is referred by general  surgery.  Dr. Adolph Pollack of general surgery planned a sentinel lymph  node biopsy, and a very wide excision of this melanoma is planned as well,  since this is an intermediate thickness.  The indications and risks were  well understood by the patient, including the possibility of skin graft loss  and wound healing problems.  He wished to proceed.   DESCRIPTION OF PROCEDURE:  The patient was already in the operating room,  and a sentinel lymph node biopsy had been completed.  he was prepped with  Betadine and draped with sterile drapes.  The measurement was then taken 1.5  cm all around the biopsy site.  This made for an excision fairly large,  since the incision site was fairly large.  The local anesthetic was  infiltrated around it.  The lesion was excised down to fascia.  The wound  was irrigated thoroughly.  Meticulous hemostasis with electrocautery.  The  specimen was tagged with a silk suture on its lateral border.  The more  posterior part of the wound was closed  with #2-0 Vicryl interrupted and  inverted deep sutures, and the skin staples.  A moist lap was placed there.  New instruments were then used and new gloves.  The area of the left neck  was prepared and measured, and the harvest taken transversely with the skin  crease.  The wound was irrigated thoroughly and meticulous hemostasis with  electrocautery.  A layered closure with #3-0 Monocryl interrupted and  inverted deep sutures and a running #3-0 Monocryl in the subcutaneous  tissues.  Steri-Strips were applied.  The skin graft was defatted.  The skin  graft was applied to the wound bed, after again checking for hemostasis.  It  was secured with skin staples.  A Xeroform gauze and then a [place] bolster  dressing was placed over and stapled securely.  He  was transferred to the recovery room in stable condition, having  tolerated the procedure well.   DISPOSITION:  Recheck next week in the office.                                              Etter Sjogren, M.D.   DB/MEDQ  D:  12/06/2003  T:  12/08/2003  Job:  [id number]

## 2011-05-07 NOTE — H&P (Signed)
[name], [name]                          ACCOUNT NO.:  [id number]   MEDICAL RECORD NO.:  [id number]                   PATIENT TYPE:  INP   LOCATION:  0450                                 FACILITY:  [facility]   PHYSICIAN:  Mohamed K. Arbutus Ped, M.D.            DATE OF BIRTH:  11/20/1931   DATE OF ADMISSION:  08/04/2004  DATE OF DISCHARGE:                                HISTORY & PHYSICAL   REASON FOR ADMISSION:  Neutropenic fever.   HISTORY:  Mr. Scalzo is a 77 year old white male with a past medical history  significant for chronic lymphocytic leukemia/lymphoma, who recently has  progression of his disease to a more aggressive subtype, presenting with  large right supraclavicular node as well as mediastinal and mesenteric  nodes.  He underwent biopsy of the right supraclavicular lymph node by Adolph Pollack, M.D., and it showed a __________ lymphocytic transformation  and he was started by Dr. Darnelle Catalan one week ago on chemotherapy with  CHOP/Rituxan, first dose on July 28, 2004.  The patient mentioned that a  few days after his treatment he started feeling fatigued and tired and today  he started having chills and fever.  His temperature at home was 101.6.  His  wife contacted me for advice, and I told her to come to the emergency room  for further evaluation and have his blood count checked.  In the emergency  room he had a CBC that showed a white blood count of 0.7 and his temperature  was 102.1.  The patient was admitted for further evaluation.  He had a blood  culture performed x2, urinalysis and urine culture and sensitivity, as well  as chest x-ray performed, and he was started on Cefepime 2 g IV q.12h. and  admitted to the hospital for further evaluation and management.  When seen  today, he denied having any other complaints except for the bony aches, and  he is taking hydrocodone for it, but on review of systems he has no  significant weight loss or night sweats.  He  denies having any chest pain,  shortness of breath, cough, or palpitation.  He denied having any nausea,  vomiting, abdominal pain, diarrhea, but mentioned four days of constipation.  No dysuria, hematuria, urgency, or increased frequency.   PAST MEDICAL HISTORY:  1. Significant mainly for the CLL/low-grade lymphoma.  2. History of back pain.   FAMILY HISTORY:  Noncontributory.   SOCIAL HISTORY:  He is married, lives with his wife.  No significant history  of smoking or alcohol abuse.   ALLERGIES:  No known drug allergies.   Current medications include hydrocodone and Neurontin.   PHYSICAL EXAMINATION:  VITAL SIGNS:  On physical exam today his blood  pressure was 111/67, pulse 107, respiratory rate 20, temperature 102.1.  GENERAL:  A 77 year old white male awake, alert, in no acute distress.  HEENT:  Normocephalic, atraumatic.  Clear oropharynx.  NECK:  Supple, no JVD, no thyromegaly or lymphadenopathy.  CHEST:  Clear to auscultation.  No wheeze, crackle, or dullness to  percussion.  CARDIOVASCULAR:  Normal S1, S2, regular rhythm and rate.  No murmur, gallop,  or rub.  ABDOMEN:  Soft, nontender, nondistended, no masses or hepatosplenomegaly.  EXTREMITIES:  No edema, 2+ pulses, no cyanosis or clubbing.  NEUROLOGIC:  No focal sensory or motor deficit.   LABORATORY DATA:  CBC showed white blood count of 0.7, hemoglobin 11.5,  hematocrit 33.7, platelets 105.  Sodium 133, potassium 4.2, BUN 13,  creatinine 0.8, calcium 8.7.  Albumin 3.3.   ASSESSMENT AND PLAN:  This is a 77 year old white male with a history of  chronic lymphocytic leukemia, now presenting with __________ lymphocytic  transformation and progression of his disease.  He is status post one cycle  of chemotherapy with CHOP/Rituxan, presenting today with neutropenic fever.  At this point will check the pending result of the blood culture, urine  culture, and chest x-ray.  Will start the patient on neutropenic  precaution  as well as neutropenic diet.  He was started on Cefepime 2 g IV q.12h.  We  may consider adding Neupogen if he did not receive Neulasta with his  treatment one week ago.  The patient was also started on IV fluids, and he  will be followed by Dr. Darnelle Catalan during his hospitalization.                                               Mohamed K. Arbutus Ped, M.D.    MKM/MEDQ  D:  08/04/2004  T:  08/05/2004  Job:  [id number]   cc:   Valentino Hue. Magrinat, M.D.  [address] [facility]  [place]  [place] [address]  Fax: [phone]   Barry Dienes. Eloise Harman, M.D.  [address]  [place]  [place] [address]  Fax: [phone]

## 2011-05-07 NOTE — Op Note (Signed)
   [name], [name]                          ACCOUNT NO.:  [id number]   MEDICAL RECORD NO.:  [id number]                   PATIENT TYPE:  AMB   LOCATION:  DSC                                  FACILITY:  [facility]   PHYSICIAN:  Christopher E. Ezzard Standing, M.D.         DATE OF BIRTH:  1931/03/11   DATE OF PROCEDURE:  04/05/2003  DATE OF DISCHARGE:                                 OPERATIVE REPORT   PREOPERATIVE DIAGNOSIS:  Leukoplakia, right lateral tongue (1 cm).   POSTOPERATIVE DIAGNOSIS:  Leukoplakia, right lateral tongue (1 cm).   PROCEDURE:  Excision of right lateral tongue leukoplakia, rule out  neoplasia.   SURGEON:  Kristine Garbe. Ezzard Standing, M.D.   ANESTHESIA:  Local 1% Xylocaine with 1:100,000 epinephrine.   COMPLICATIONS:  None.   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  He has redeveloped an area of leukoplakia  along the right lateral tongue and is taken to the operating room at this  time for local excision under local anesthetic.   DESCRIPTION OF PROCEDURE:  The area of leukoplakia on the right  posterolateral tongue was injected with Xylocaine with epinephrine.  The  leukoplakia was then totally excised with scalpel and scissors and sent to  pathology.  The defect was closed with three interrupted 5-0 chromic  sutures.  The patient tolerated this well.  He is subsequently discharged  home.   DISPOSITION:  The patient will call the office next week concerning results  of the pathology, instructed to take Tylenol p.r.n. pain, will notify us if  he has any problems.                                               Kristine Garbe. Ezzard Standing, M.D.    CEN/MEDQ  D:  04/05/2003  T:  04/05/2003  Job:  [id number]

## 2011-05-07 NOTE — Op Note (Signed)
[place]  Patient:    NIRAV SWEDA                        MRN: [id number] Proc. Date: 12/23/99 Adm. Date:  [id number] Attending:  Marlowe Shores CC:         Artist Pais Mina Marble, M.D. (2)                           Operative Report  PREOPERATIVE DIAGNOSIS:  Dorsal mass, right hand.  POSTOPERATIVE DIAGNOSIS:  Dorsal mass, right hand.  OPERATION PERFORMED:  Excisional biopsy of mass, dorsal aspect, right hand.  SURGEON:  Artist Pais. Mina Marble, M.D.  ANESTHESIA:  Bier block.  TOURNIQUET TIME:  20 minutes.  COMPLICATIONS:  None.  DRAINS:  None.  SPECIMENS:  One sent.  DESCRIPTION OF PROCEDURE:  The patient was taken to the operating room and after the induction of adequate Bier block analgesia, the right upper extremity was prepped and draped in the usual sterile fashion.  Once this was done, a mass over the dorsal aspect of the right hand in line with the ring finger metacarpal was  excised in elliptical type fashion.  The incision was made through the skin and  subcutaneous tissues and was carefully dissected off the peritenon.  The lesion  appeared to be in the skin only with no extension to the soft tissues.  The entire elliptical shaped piece of skin and lesion were excised and sent for pathologic  confirmation.  The wound was then thoroughly irrigated and then closed with 4-0  nylon in horizontal mattress sutures x 6.  Sterile dressing of Xeroform, 4 x 4s, fluffs and a compressive dressing were applied.  The patient tolerated the procedure well and went to the recovery room in stable fashion. DD:  12/23/99 TD:  12/23/99 Job: [id number]

## 2011-05-07 NOTE — Op Note (Signed)
   NAMEJEHAD, BISONO                          ACCOUNT NO.:  [id number]   MEDICAL RECORD NO.:  [id number]                   PATIENT TYPE:  AMB   LOCATION:  ENDO                                 FACILITY:  [facility]   PHYSICIAN:  James L. Malon Kindle., M.D.          DATE OF BIRTH:  1931/04/21   DATE OF PROCEDURE:  10/25/2002  DATE OF DISCHARGE:                                 OPERATIVE REPORT   PROCEDURE:  Esophagogastroduodenoscopy.   MEDICATIONS:  1. Cetacaine spray.  2. Fentanyl 40 mcg.  3. Versed 4 mg IV.   INDICATIONS FOR PROCEDURE:  A nice gentleman who presented back in [place] at  [facility] with an acute GI bleed.  Endoscopy showed an  esophagitis and what was felt to be a nasty looking ulcer in the GE junction  and could not be sure that it was not malignant.  The patient responded to  therapy for reflux.  This procedure was done to document healing of this  ulcer.   DESCRIPTION OF PROCEDURE:  The procedure was explained to the patient and  consent obtained.  The patient was placed in the left lateral decubitus  position.  The Olympus video endoscope inserted blindly in the esophagus and  advanced under direct visualization.  The stomach was entered, in the  pyloris could not find a pass.  Duodenum, duodenal bulb and second portion  seen well.  The scope was withdrawn back into the stomach and the antrum.  Examination was completely normal.  Fundus and cardia seen well in the  retroflexed view and were normal.  There was a hiatal hernia with a  stricture/ring there.  There was a healing linear esophageal ulcer that was  nearly healed. The esophagus was otherwise normal.  There were no signs of  Barrett's esophagus.  Scope was withdrawn and the patient tolerated the  procedure well.   ASSESSMENT:  Esophageal ulcer appears to be healing by endoscopy. No  evidence of malignancy or Barrett's.   PLAN:  Will continue treatment for reflux.  Will give her reflux  sheet and  Nexium.  We will her back in the office in six months.                                               James L. Malon Kindle., M.D.    Waldron Session  D:  10/25/2002  T:  10/25/2002  Job:  [id number]

## 2011-05-07 NOTE — Discharge Summary (Signed)
[name], [name]                          ACCOUNT NO.:  [id number]   MEDICAL RECORD NO.:  [id number]                   PATIENT TYPE:  INP   LOCATION:  0450                                 FACILITY:  [facility]   PHYSICIAN:  Valentino Hue. Magrinat, M.D.            DATE OF BIRTH:  06-14-1931   DATE OF ADMISSION:  08/04/2004  DATE OF DISCHARGE:  08/07/2004                                 DISCHARGE SUMMARY   DISCHARGE DIAGNOSES:  1. Fever with neutropenia.                                               Valentino Hue. Magrinat, M.D.    Ronna Polio  D:  08/13/2004  T:  08/13/2004  Job:  [id number]

## 2011-05-07 NOTE — Op Note (Signed)
[name], OAXACA                          ACCOUNT NO.:  [id number]   MEDICAL RECORD NO.:  [id number]                   PATIENT TYPE:  AMB   LOCATION:  DSC                                  FACILITY:  [facility]   PHYSICIAN:  Adolph Pollack, M.D.            DATE OF BIRTH:  [birth year]   DATE OF PROCEDURE:  07/08/2004  DATE OF DISCHARGE:                                 OPERATIVE REPORT   PREOPERATIVE DIAGNOSIS:  Lymphadenopathy.   POSTOPERATIVE DIAGNOSIS:  Lymphadenopathy.   PROCEDURE:  Deep right supraclavicular lymph node biopsy.   SURGEON:  Adolph Pollack, M.D.   ANESTHESIA:  Local (1% lidocaine with epinephrine plus 0.5% plain Marcaine  plus sodium bicarbonate) and MAC.   INDICATION:  This is a 77 year old male with history of chronic lymphocytic  leukemia/lymphoma.  He has significant lymphadenopathy and is noted to have  a large right supraclavicular node as well as mediastinal nodes and  mesenteric nodes.  He now presents for a right supraclavicular lymph node  biopsy.  The procedure and risks including but not limited bleeding,  infection and accidental nerve damage were explained to him preop.   TECHNIQUE:  He was seen in the holding area and the right neck marked with  my initials.  He was then brought to the operating room and placed supine on  the operating table and given intravenous sedation.  His head was rotated to  the left.  The right supraclavicular area was sterilely prepped and draped.  Local anesthetic was infiltrated directly in the right supraclavicular area  and an incision was made through the skin, subcutaneous tissues and platysma  muscle.  I used muscle-splitting to split the sternocleidomastoid.  I did  not use any cautery below this level.  Using careful blunt dissection, I was  able to deliver a large lymph node into the wound.  I clamped its lymphatic  drainage and cut it and then sent the lymph node all fresh to pathology.  I  then ligated  the lymphatic with Vicryl suture.   I controlled hemostasis using just direct pressure.  When hemostasis was  adequate, I gently reapproximated the sternocleidomastoid muscle with  interrupted 3-0 Vicryl sutures and closed the platysma muscle with a running  3-0 Vicryl suture.  The skin was closed with a 4-0 Monocryl subcuticular  stitch.  Steri-Strips and sterile dressings were applied.   He tolerated the procedure well without any apparent complications and he  was taken to the recovery room in satisfactory condition.  Preoperatively, I  had talked with him about instructions including keeping his head elevated,  putting ice on it and calling me if he had any problems with bleeding or  other wound problems.  Adolph Pollack, M.D.    Kari Baars  D:  07/08/2004  T:  07/08/2004  Job:  [id number]   cc:   Valentino Hue. Magrinat, M.D.  [address] [facility]  [place]  [place] [address]  Fax: [phone]   Barry Dienes. Eloise Harman, M.D.  [address]  [place]  [place] [address]  Fax: [phone]

## 2011-05-07 NOTE — Op Note (Signed)
NAMECHANSON, TEEMS                ACCOUNT NO.:  [id number]   MEDICAL RECORD NO.:  [id number]          PATIENT TYPE:  AMB   LOCATION:  DSC                          FACILITY:  [facility]   PHYSICIAN:  Adolph Pollack, M.D.DATE OF BIRTH:  20-Jan-1931   DATE OF PROCEDURE:  06/10/2005  DATE OF DISCHARGE:                                 OPERATIVE REPORT   PREOPERATIVE DIAGNOSIS:  Left inguinal hernia.   POSTOPERATIVE DIAGNOSIS:  Indirect left inguinal hernia   PROCEDURE:  Left inguinal hernia repair with mesh.   SURGEON:  Dr. Abbey Chatters.   ANESTHESIA:  MAC with local, (1% lidocaine with epinephrine plus 0.5% plain  Marcaine).   INDICATIONS:  Mr. Grammatico is a 77 year old male who has had a right inguinal  hernia repair performed in the past. He presented to me after having some  pain in the left groin and developing swelling after doing some heavy  lifting and he was noted to have a left inguinal hernia. He now presents for  repair. The procedure risks and aftercare were discussed with him  preoperatively.   TECHNIQUE:  He is seen in the holding area and the left groin marked with my  initials. He is then brought to the operating room, placed supine on the  operating table and was given intravenous sedation. The hair in the left  colon was clipped and the area was sterilely prepped and draped. Local  anesthetic was infiltrated superficially and deep in the left groin and the  left groin incision was made through the skin and subcutaneous tissue and  Scarpa's fascia until the external oblique aponeurosis was exposed. Local  anesthetic was infiltrated deep to the external oblique aponeurosis and an  incision was made in the external oblique aponeurosis and it was split in  the direction of its fibers through the external ring medially and up toward  the anterior superior iliac spine laterally. Using blunt dissection, I  exposed the internal oblique muscle and aponeurosis and the shelving  edge of  the inguinal ligament to the pubic tubercle. The ilioinguinal and  iliohypogastric nerves were identified and preserved. The spermatic cord was  encircled using blunt dissection. A large lipoma of the cord was noted as  well as an indirect hernia sac which was stripped from the cord and reduced  through a patulous internal ring. The lipoma of the cord was then dissected  free from the cord and excised and sent to pathology.   Following this, a piece of 3 x 6 inch polypropylene mesh was brought into  the field and anchored 1 cm medial to the pubic tubercle with 2-0 Prolene  suture. The inferior aspect of the mesh was then anchored to the shelving  edge of the inguinal ligament with a running 2-0 Prolene suture until it was  1 cm lateral to the internal ring and then the suture was tied. A slit was  cut in the mesh and wrapped around the spermatic cord. The superior aspect  of the mesh was anchored to the internal oblique aponeurosis with  interrupted 2-0 Vicryl sutures. The two  cords were then crossed creating a  new internal ring and these were anchored to the shelving edge of the  inguinal ligament with a single 2-0 Prolene suture. The aperture was wide  enough to introduce the tip of the hemostat and the cord was mobile.   The lateral aspects of the cord were then tucked deep to the external  oblique aponeurosis. The external oblique aponeurosis was then closed with a  running 3-0 Vicryl suture after hemostasis was noted to be adequate.  Scarpa's fascia was closed with a running 2-0 Vicryl suture. The skin was  closed with 4-0 Monocryl subcuticular stitch followed by Steri-Strips and  sterile dressings.   He tolerated the procedure without any apparent complications.  He was taken  to recovery in satisfactory condition. He will be given discharge  instructions and Tylox for pain and to follow up with me in the office in  two to three weeks.       TJR/MEDQ  D:   06/10/2005  T:  06/10/2005  Job:  [id number]   cc:   Ivery Quale, M.D.   Valentino Hue. Magrinat, M.D.  [address] [facility]  [place]  [place] [address]  Fax: [phone]

## 2011-05-07 NOTE — Procedures (Signed)
[facility]  Patient:    [name], [name]                       MRN: [id number] Proc. Date: 02/17/01 Adm. Date:  [id number] Disc. Date: [id number] Attending:  Orland Mustard CC:         Barry Dienes. Eloise Harman, M.D.   Procedure Report  PROCEDURE:  Colonoscopy with polypectomy.  MEDICATIONS:  Fentanyl 50 mcg, Versed 5 mg IV.  SCOPE:  Olympus adult video colonoscope.  INDICATIONS FOR PROCEDURE:  Strong family history of colon cancer.  DESCRIPTION OF PROCEDURE:  The procedure had been explained to the patient and consent obtained. With the patient in the left lateral decubitus position, the Olympus adult video colonoscope was inserted and advanced under direct visualization. We were able to reach the cecum. The patient had a 3/4 cm sessile polyp in the cecum that was snared. There was a small amount of bleeding, this was irrigated, touched with the tip of the snare and cauterized and bleeding stopped. The polyp was recovered. The scope was withdrawn and the remainder of the cecum, ascending colon, hepatic flexure, transverse colon, splenic flexure, descending and sigmoid colon were seen well with some diverticulosis of a moderate nature. No other polyp seen. The scope withdrawn and the patient tolerated the procedure well.  ASSESSMENT:  Cecal polyp removed.  PLAN:  Routine post polypectomy instructions. Will recommend repeating in three years. DD:  02/17/01 TD:  02/20/01 Job: [id number]

## 2011-05-07 NOTE — Discharge Summary (Signed)
[name], [name]                          ACCOUNT NO.:  [id number]   MEDICAL RECORD NO.:  [id number]                   PATIENT TYPE:  INP   LOCATION:  0450                                 FACILITY[facility]  [facility]   PHYSICIAN:  Valentino Hue. Magrinat, M.D.            DATE OF BIRTH:  1931-04-22   DATE OF ADMISSION:  08/04/2004  DATE OF DISCHARGE:  08/07/2004                                 DISCHARGE SUMMARY   DISCHARGE DIAGNOSES:  1. Fever with neutropenia.  2. Non Hodgkins lymphoma.  3. Diarrhea.  4. History of back pain.   PROCEDURE:  Intravenous antibiotics.   HOSPITAL COURSE:  The patient was admitted and pan cultured.  All cultures  remain negative at the time of discharge.  He had a chest x-ray which showed  no evidence of infiltrate.  He was started on Primaxin and rapidly  defervesced so that the patient has been afebrile for the past 48 hours with  stable vital signs.  He received Neupogen in additional to the Neulasta that  he had earlier, and his white cell count has risen so that at the time of  discharge his white cell count is greater than 4000, his hemoglobin greater  than 10, and his platelet count is rising at greater than 120,000.   The patient did have diarrhea upon admission, this lasted approximately 24  hours.  He was aggressively hydrated, at the time of discharge the patient  has had no bowel movement for the past 24 hours and is very comfortable.   CONDITION ON DISCHARGE:  Much improved.   DISCHARGE MEDICATIONS:  1. Neurontin 300 mg t.i.d. and notice that we have stopped the patient's     Vicodin at this point and he and his wife are particularly eager to drop     the Neurontin as well.  We will be working on that as an outpatient.  2. Diflucan 100 mg daily x7 days.  Mrs. Wilk feels it is helping the     patient's facial rash.  Actually, I think what is helping that is the     chemotherapy but in any case he will have Diflucan for an additional 7     days.  3. Augmentin 875 mg b.i.d. and Cipro 500 mg b.i.d. x an additional 5 days,     and then he may take Imodium, Senokot or Mylanta p.r.n. as well as     Tylenol or Aleve p.r.n.   ACTIVITY:  He is to keep his hands washed but is otherwise unrestricted.   DIET:  He is to drink 1-2 quarts of liquid a day but otherwise it is  unrestricted.   WOUND CARE:  Is not applicable.   SPECIAL INSTRUCTIONS:  He will call for a temperature of 101 or greater,  diarrhea, constipation, pain or any other problems.   FOLLOW UP:  He will call the office for his follow up appointment,  his next  chemotherapy being on  August 18, 2004.  He will make sure I am seeing him at 10-12 days after that  dose.                                               Valentino Hue. Magrinat, M.D.    Ronna Polio  D:  08/07/2004  T:  08/08/2004  Job:  [id number]   cc:   Barry Dienes. Eloise Harman, M.D.  [address]  [place]  [place] [address]  Fax: [phone]

## 2011-05-07 NOTE — Assessment & Plan Note (Signed)
[facility]                              CARDIOLOGY OFFICE NOTE   NAME:[name], Ronald SARGENT                       MRN:          [id number]  DATE:07/21/2006                            DOB:          1930/12/23    HISTORY:  [name] comes in today for further management of his coronary  artery disease.  He is having no angina.  He is remaining quite active,  playing golf and riding motorcycles.   He is not tolerating Pravachol 10 mg a day.  He has been having muscle aches  and stopped it and they have resolved.   He had a LipoScience last May, 2006, which was remarkably good.  He was on  Pravachol 10 mg at that time, he thinks.   He had a stress Myoview last year which showed no ischemia, EF 48%.   EXAMINATION:  VITAL SIGNS:  Blood pressure 128/80, pulse 75 and regular.  Weight 147.  GENERAL:  He has a slightly ruddy complexion.  He is in no acute distress.  SKIN:  Warm and dry.  NECK:  Carotids are full without bruits.  No JVD.  Thyroid is not enlarged.  Trachea is midline.  LUNGS:  Clear.  HEART:  Reveals regular rate and rhythm without murmur or gallop.  ABDOMEN:  Soft with good bowel sounds.  EXTREMITIES:  No clubbing, cyanosis or edema.  Pulses are intact.   LABORATORY DATA:  His EKG shows sinus rhythm with no EKG changes.   IMPRESSION:  [name] is doing well.  He is happy just on 81 mg of aspirin.  Will check a lipid panel today, just to get a baseline value for future  reference. I have asked him to stay off the Pravachol since it is causing  him so much trouble.  I do not think he will tolerate any other statin.  He  really does not want to take medicines in general.  It is hard to argue with  him doing so well.  Will see him back in a year.                               Thomas C. Daleen Squibb, MD, [facility]    TCW/MedQ  DD:  07/21/2006  DT:  07/21/2006  Job #:  [id number]   cc:   Valentino Hue. Magrinat, MD

## 2011-05-07 NOTE — Op Note (Signed)
[name], [name]                          ACCOUNT NO.:  [id number]   MEDICAL RECORD NO.:  [id number]                   PATIENT TYPE:  OIB   LOCATION:  6708                                 FACILITY:  [facility]   PHYSICIAN:  Adolph Pollack, M.D.            DATE OF BIRTH:  [birth year]   DATE OF PROCEDURE:  12/06/2003  DATE OF DISCHARGE:  12/07/2003                                 OPERATIVE REPORT   PREOPERATIVE DIAGNOSES:  1. Malignant melanoma of the right forehead.  2. Right inguinal hernia.   POSTOPERATIVE DIAGNOSES:  1. Malignant melanoma of the right forehead.  2. Right inguinal hernia.   PROCEDURE:  1. Right cervical sentinel lymph node biopsy.  2. Right inguinal hernia repair with mesh.   SURGEON:  Adolph Pollack, M.D.   ASSISTANT:  Alvira Philips, M.D.   ANESTHESIA:  General.   INDICATIONS FOR PROCEDURE:  Mr. Quirino is a 77 year old male who was having  some right groin pain and who has been found to have a right inguinal  hernia. Of note was that he had a biopsy of a right forehead mole by Dr.  Lonni Fix which was metastatic melanoma of intermediate thickness. He has seen  Dr. Odis Luster for radical excision and skin grafting. He now presents for that  procedure as well as the above procedures.   DESCRIPTION OF PROCEDURE:  He was seen in the holding area. At 8:20 in the  morning he had the technetium sulfa colloid injected. He was brought back to  the operating room and a general anesthetic was administered. The right neck  area and right groin were sterilely prepped and draped.   At approximately 9:50 a.m. after lymphatic mapping had been performed and  the area of radioactivity detected in the right cervical area just inferior  to the angle of the mandible, an incision was made over this area through  the skin and subcutaneous tissue. The platysma was divided with cautery.   By using blunt dissection I dissected down to the cervical space. Using the  Neoprobe, I  identified  a very small lymph node with fairly high counts of  around 400 with background of 0 and called the sentinel lymph node. I also  identified  another node, but this had relatively few counts and I sent this  separately as just a large cervical lymph node as it measured a little bit  more than 1 cm. I still had some counts up to 60, and on further  dissection, a larger node was identified with counts of approximately 190,  and this was removed as well. Following this, the counts were 25 or less.  Thus I sent 2 sentinel lymph nodes out for pathologic evaluation  and  permanent section.   During the dissection the vessels and nerves were identified  and preserved.  Hemostasis was adequate and I closed the wound in 2 layers by approximating  the platysma with interrupted Vicryl suture and closed the skin with 4-0  Monocryl subcuticular stitch. Steri-Strips were applied.   Next gown and gloves were changed and different instruments were used. I  approached the right groin and anesthetized it locally with plain Marcaine.  A right groin incision was made through the skin and subcutaneous tissue and  the Scarpa's fascia until the external oblique aponeurosis was exposed.  Local anesthesia was infiltrated deep into the external oblique aponeurosis  and an incision was made in it and it was split in the direction of its  fibers through the  external ring medially and up towards the anterior  superior  iliac spine laterally. Blunt dissection was used to expose the  shelving edge of the inguinal ligament and the internal oblique muscle and  aponeurosis.   The ilioinguinal nerve  was identified and the epineurium was anesthetized  with the Marcaine. I isolated the spermatic cord and noticed an indirect  sac. I noticed that there was some nodularity at the distal  portion of the  sac. I separated the sac from the spermatic cord structures and also  separated a lipoma of the cord which I  excised. I subsequently clamped the  sac, excised it and ligated it with silk suture. I sent the sac to pathology  along with the lipoma of the cord. The direct area was fairly solid.   I brought in a piece of 3 x 6 inch polypropylene  mesh and then anchored it  1 cm medial to the pubic tubercle. The inferior aspect of the mesh was  anchored to the shelving edge of the inguinal ligament with a running 2-0  Prolene suture to an area 1 cm lateral  to the internal ring. A slit was cut  in the mesh and the 2 tails were wrapped around the spinal cord. The  superior aspect of the mesh was then anchored to the internal oblique  aponeurosis with interrupted 2-0 Vicryl sutures.   The 2 tails of the mesh were crossed, creating a new internal ring and  they  were anchored to the shelving edge of the inguinal ligament with a single 2-  0 Prolene suture. I could fit the tip of the hemostat through the new  aperture. This provided for more than adequate coverage of the direct and  indirect spaces.   The lateral aspects of the mesh were tucked deep to the external oblique  aponeurosis. Hemostasis was adequate. The external oblique aponeurosis was  closed over the mesh with a running 3-0 Vicryl suture. The Scarpa's fascia  was closed with a running 2-0 Vicryl suture. The skin was closed with a 4-0  Monocryl subcuticular stitch. Steri-Strips and sterile dressings were  applied.   Dr. Odis Luster was proceeding with his part of the radical excision of the  melanoma and skin grafting. He tolerated my above procedures well without  any apparent complications.                                               Adolph Pollack, M.D.    Kari Baars  D:  12/06/2003  T:  12/08/2003  Job:  [id number]   cc:   Laurice Record., M.D.  [phone] [address]  [place]  [place] [address]  Fax: [phone]   Valentino Hue. Magrinat, M.D.  [address] [facility]  [place]  [place] [address]  Fax: [phone]  Barry Dienes. Eloise Harman, M.D.   [address]  [place]  [place] [address]  Fax: [phone]   Etter Sjogren, M.D.  [address]  [address]  [place]  [place] [address]  Fax: [phone]

## 2011-09-29 LAB — CBC
HCT: 35.1 — ABNORMAL LOW
HCT: 45
Hemoglobin: 15.5
MCV: 86.8
MCV: 87.5
Platelets: 128 — ABNORMAL LOW
RDW: 16.8 — ABNORMAL HIGH
RDW: 17.3 — ABNORMAL HIGH
WBC: 10.1

## 2011-09-29 LAB — BASIC METABOLIC PANEL
BUN: 6
Chloride: 107
Creatinine, Ser: 0.85
GFR calc Af Amer: >60
GFR calc non Af Amer: >60
Glucose, Bld: 100 — ABNORMAL HIGH
Glucose, Bld: 127 — ABNORMAL HIGH
Potassium: 3.5
Potassium: 3.9
Sodium: 136

## 2011-10-01 LAB — DIFFERENTIAL
Basophils Relative: 1
Eosinophils Relative: 7 — ABNORMAL HIGH
Monocytes Absolute: 0.6
Monocytes Relative: 8
Neutro Abs: 4.1

## 2011-10-01 LAB — BASIC METABOLIC PANEL
BUN: 9
CO2: 28
Calcium: 9.5
Chloride: 103
Creatinine, Ser: 0.93
GFR calc Af Amer: >60
GFR calc non Af Amer: >60
Glucose, Bld: 95
Potassium: 4.7
Sodium: 137

## 2011-10-01 LAB — TYPE AND SCREEN
ABO/RH(D): O NEG
Antibody Screen: NEGATIVE

## 2011-10-01 LAB — ABO/RH: ABO/RH(D): O NEG

## 2011-10-01 LAB — CBC
HCT: 48.5
Hemoglobin: 16.6
MCHC: 34.3
MCV: 85.3
Platelets: 112 — ABNORMAL LOW
Platelets: 120 — ABNORMAL LOW
RBC: 5.69
RDW: 15.4 — ABNORMAL HIGH
RDW: 15.6 — ABNORMAL HIGH
WBC: 10.6 — ABNORMAL HIGH
WBC: 7.7

## 2011-10-01 LAB — COMPREHENSIVE METABOLIC PANEL
ALT: 56 — ABNORMAL HIGH
AST: 47 — ABNORMAL HIGH
Albumin: 4.1
Alkaline Phosphatase: 90
BUN: 14
GFR calc Af Amer: >60
Potassium: 4.8
Sodium: 143
Total Protein: 6.6

## 2014-02-19 ENCOUNTER — Telehealth: Payer: Self-pay | Admitting: *Deleted

## 2014-04-22 NOTE — Telephone Encounter (Signed)
Encounter Closed---04/22/14 TP 

## 2014-05-18 ENCOUNTER — Other Ambulatory Visit: Payer: Self-pay | Admitting: Oncology
